# Patient Record
Sex: Female | Born: 1990 | Race: Black or African American | Hispanic: No | Marital: Single | Smoking: Never smoker
Health system: Southern US, Community
[De-identification: ages and names within clinical notes are randomized; demographics above are authoritative.]

## PROBLEM LIST (undated history)

## (undated) ENCOUNTER — Inpatient Hospital Stay (HOSPITAL_COMMUNITY): Payer: Self-pay

## (undated) DIAGNOSIS — R51 Headache: Secondary | ICD-10-CM

## (undated) DIAGNOSIS — G43909 Migraine, unspecified, not intractable, without status migrainosus: Secondary | ICD-10-CM

## (undated) HISTORY — PX: FOOT SURGERY: SHX648

---
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
## 2012-01-18 ENCOUNTER — Emergency Department (HOSPITAL_COMMUNITY)
Admission: EM | Admit: 2012-01-18 | Discharge: 2012-01-18 | Disposition: A | Discharge status: Home / Self Care | Attending: Emergency Medicine | Admitting: Emergency Medicine

## 2012-01-18 ENCOUNTER — Emergency Department (HOSPITAL_COMMUNITY)

## 2012-01-18 ENCOUNTER — Encounter (HOSPITAL_COMMUNITY): Payer: Self-pay

## 2012-01-18 DIAGNOSIS — R1031 Right lower quadrant pain: Secondary | ICD-10-CM | POA: Insufficient documentation

## 2012-01-18 DIAGNOSIS — R109 Unspecified abdominal pain: Secondary | ICD-10-CM

## 2012-01-18 LAB — BASIC METABOLIC PANEL
Calcium: 9.1 mg/dL (ref 8.4–10.5)
Creatinine, Ser: 0.78 mg/dL (ref 0.50–1.10)
GFR calc non Af Amer: >90 mL/min (ref >90)
Glucose, Bld: 86 mg/dL (ref 70–99)
Sodium: 139 mEq/L (ref 135–145)

## 2012-01-18 LAB — CBC WITH DIFFERENTIAL/PLATELET
Eosinophils Absolute: 0.1 10*3/uL (ref 0.0–0.7)
Lymphs Abs: 1.9 10*3/uL (ref 0.7–4.0)
MCH: 23.4 pg — ABNORMAL LOW (ref 26.0–34.0)
MCHC: 32.8 g/dL (ref 30.0–36.0)
Monocytes Absolute: 0.6 10*3/uL (ref 0.1–1.0)
Neutrophils Relative %: 59 % (ref 43–77)
Platelets: 304 10*3/uL (ref 150–400)

## 2012-01-18 LAB — URINALYSIS, ROUTINE W REFLEX MICROSCOPIC
Ketones, ur: NEGATIVE mg/dL
Protein, ur: NEGATIVE mg/dL
Urobilinogen, UA: 0.2 mg/dL (ref 0.0–1.0)

## 2012-01-18 LAB — URINE MICROSCOPIC-ADD ON

## 2012-01-18 LAB — POCT PREGNANCY, URINE: Preg Test, Ur: NEGATIVE

## 2012-01-18 MED ORDER — HYDROCODONE-ACETAMINOPHEN 5-325 MG PO TABS: 1.0000 | ORAL_TABLET | ORAL | Status: AC | PRN

## 2012-01-18 MED ORDER — HYDROMORPHONE HCL PF 2 MG/ML IJ SOLN
2.0000 mg | Freq: Once | INTRAMUSCULAR | Status: AC
  Administered 2012-01-18: 2 mg via INTRAMUSCULAR
  Filled 2012-01-18: qty 1

## 2012-01-18 NOTE — ED Notes (Signed)
Patient reports tht she has had RLQ pain that is sharp and shooting. Patient's mother denies fever, vomiting, or diarrhea.

## 2012-01-18 NOTE — ED Notes (Signed)
Family at bedside.(Pt's mother) 

## 2012-01-18 NOTE — ED Notes (Signed)
Patient transported to Ultrasound per pt's mother

## 2012-01-18 NOTE — ED Notes (Signed)
Pt aware of the need for a urine sample however, is unable to void at this time. 

## 2012-01-18 NOTE — ED Provider Notes (Signed)
History     CSN: [id number]  Arrival date & time 01/18/12  0807   First MD Initiated Contact with Patient 01/18/12 0827      Chief Complaint  Patient presents with  . Abdominal Pain    (Consider location/radiation/quality/duration/timing/severity/associated sxs/prior treatment) HPI Comments: Patient reports right lower quadrant pain that started immediately early this morning. She reports laying in bed during onset. She describes the pain as sharp, severe and does not radiate. The pain is constant, as it is currently present. She did not take anything for pain relief. She denies any alleviating factors. She denies fever, NVD, vaginal bleeding, vaginal discharge, dysuria. LMP was one month ago.   Patient is a 21 y.o. female presenting with abdominal pain.  Abdominal Pain The primary symptoms of the illness include abdominal pain.    History reviewed. No pertinent past medical history.  Past Surgical History  Procedure Date  . Foot surgery   . Foot surgery     History reviewed. No pertinent family history.  History  Substance Use Topics  . Smoking status: Never Smoker   . Smokeless tobacco: Not on file  . Alcohol Use: No    OB History    Grav Para Term Preterm Abortions TAB SAB Ect Mult Living                  Review of Systems  Gastrointestinal: Positive for abdominal pain.  All other systems reviewed and are negative.    Allergies  Review of patient's allergies indicates no known allergies.  Home Medications   Current Outpatient Rx  Name Route Sig Dispense Refill  . IBUPROFEN 200 MG PO TABS Oral Take 400 mg by mouth every 8 (eight) hours as needed. For pain.      BP 143/97  Pulse 111  Temp 98.6 F (37 C) (Oral)  Resp 20  Ht 5\' 5"  (1.651 m)  Wt 170 lb (77.111 kg)  BMI 28.29 kg/m2  SpO2 100%  LMP 01/18/2012  Physical Exam  Nursing note and vitals reviewed. Constitutional: She is oriented to person, place, and time. She appears well-developed and  well-nourished. She appears distressed.       Patient is upset and in pain.   HENT:  Head: Normocephalic and atraumatic.  Eyes: Conjunctivae are normal. No scleral icterus.  Neck: Normal range of motion. Neck supple.  Cardiovascular: Normal rate and regular rhythm.  Exam reveals no gallop and no friction rub.   No murmur heard. Pulmonary/Chest: Effort normal and breath sounds normal. No respiratory distress. She has no wheezes. She has no rales. She exhibits no tenderness.  Abdominal: Soft. She exhibits no distension and no mass. There is tenderness. There is guarding.       Tenderness to light palpation of RLQ.   Musculoskeletal: Normal range of motion.  Neurological: She is alert and oriented to person, place, and time. Coordination normal.  Skin: Skin is warm and dry. She is not diaphoretic.  Psychiatric: She has a normal mood and affect.    ED Course  Procedures (including critical care time)  Labs Reviewed  CBC WITH DIFFERENTIAL - Abnormal; Notable for the following:    Hemoglobin 11.3 (*)     HCT 34.4 (*)     MCV 71.4 (*)     MCH 23.4 (*)     All other components within normal limits  BASIC METABOLIC PANEL  URINALYSIS, ROUTINE W REFLEX MICROSCOPIC   No results found.   No diagnosis found.  MDM  10:30 AM Patient will have a pelvic ultrasound to evaluation for ovarian cyst and ovarian torsion. She will be kept comfortable with dilaudid.   Patient signed out to Dr. Patria Mane.       Emilia Beck, PA-C 01/21/12 1639

## 2012-01-18 NOTE — ED Notes (Signed)
Pt reports RLQ pain that started this am, denies any n/v/d or fever at this time.  Pt reports starting her period today as well.  Pt is guarding her RLQ at present.  Reports LBM x 3 days ago, states that this is normal for her.

## 2012-01-22 NOTE — ED Provider Notes (Signed)
Medical screening examination/treatment/procedure(s) were conducted as a shared visit with non-physician practitioner(s) and myself.  I personally evaluated the patient during the encounter  Repeat abdominal exam without any RLQ tenderness or guarding. Pelvic US normal. Dc home with pcp follow up and instructions to return to ER for new or worsening symptoms  1. Abdominal pain     US Transvaginal Non-ob  01/18/2012  *RADIOLOGY REPORT*  Clinical Data:  Severe right lower quadrant pain.  Evaluate for torsion.  LMP 01/18/2012  TRANSABDOMINAL AND TRANSVAGINAL ULTRASOUND OF PELVIS DOPPLER ULTRASOUND OF OVARIES  Technique:  Both transabdominal and transvaginal ultrasound examinations of the pelvis were performed. Transabdominal technique was performed for global imaging of the pelvis including uterus, ovaries, adnexal regions, and pelvic cul-de-sac.  It was necessary to proceed with endovaginal exam following the transabdominal exam to visualize the myometrium, endometrium and adnexa.  Color and duplex Doppler ultrasound was utilized to evaluate blood flow to the ovaries.  Comparison:  None.  Findings:  Uterus:  Demonstrates a sagittal length of 8.8 cm, depth of 3.6 cm and width of 3.4 cm.  A homogeneous myometrium is seen  Endometrium:  Appears thin and echogenic with a width of 3.5 mm. No areas of focal thickening or heterogeneity are seen and this would correlate with a secretory phase of the cycle and correspond with the provided LMP of 01/18/2012.  Right ovary: Measures 4.5 x 2.1 x 2.5 cm and has a normal appearance.  Color Doppler assessment reveals flow within the central aspect of the ovarian stroma  Left ovary:   Has a normal appearance measuring 4.3 x 2.2 x 2.3 cm and is best visualized transabdominally due to the high lateral position.  Color Doppler reveals the presence of flow within the central portion of the ovarian stroma.  Pulsed Doppler evaluation demonstrates normal low-resistance arterial and  venous waveforms in both ovaries.  Other:  No pelvic fluid or separate adnexal masses are seen.  IMPRESSION: Normal secretory appearance to the uterus and ovaries.  No sonographic evidence for ovarian torsion.   Original Report Authenticated By: Bertha Stakes, M.D.    US Pelvis Complete  01/18/2012  *RADIOLOGY REPORT*  Clinical Data:  Severe right lower quadrant pain.  Evaluate for torsion.  LMP 01/18/2012  TRANSABDOMINAL AND TRANSVAGINAL ULTRASOUND OF PELVIS DOPPLER ULTRASOUND OF OVARIES  Technique:  Both transabdominal and transvaginal ultrasound examinations of the pelvis were performed. Transabdominal technique was performed for global imaging of the pelvis including uterus, ovaries, adnexal regions, and pelvic cul-de-sac.  It was necessary to proceed with endovaginal exam following the transabdominal exam to visualize the myometrium, endometrium and adnexa.  Color and duplex Doppler ultrasound was utilized to evaluate blood flow to the ovaries.  Comparison:  None.  Findings:  Uterus:  Demonstrates a sagittal length of 8.8 cm, depth of 3.6 cm and width of 3.4 cm.  A homogeneous myometrium is seen  Endometrium:  Appears thin and echogenic with a width of 3.5 mm. No areas of focal thickening or heterogeneity are seen and this would correlate with a secretory phase of the cycle and correspond with the provided LMP of 01/18/2012.  Right ovary: Measures 4.5 x 2.1 x 2.5 cm and has a normal appearance.  Color Doppler assessment reveals flow within the central aspect of the ovarian stroma  Left ovary:   Has a normal appearance measuring 4.3 x 2.2 x 2.3 cm and is best visualized transabdominally due to the high lateral position.  Color Doppler reveals the presence  of flow within the central portion of the ovarian stroma.  Pulsed Doppler evaluation demonstrates normal low-resistance arterial and venous waveforms in both ovaries.  Other:  No pelvic fluid or separate adnexal masses are seen.  IMPRESSION: Normal  secretory appearance to the uterus and ovaries.  No sonographic evidence for ovarian torsion.   Original Report Authenticated By: Bertha Stakes, M.D.    [place] Art/ven Flow Abd Pelv Doppler  01/18/2012  *RADIOLOGY REPORT*  Clinical Data:  Severe right lower quadrant pain.  Evaluate for torsion.  LMP 01/18/2012  TRANSABDOMINAL AND TRANSVAGINAL ULTRASOUND OF PELVIS DOPPLER ULTRASOUND OF OVARIES  Technique:  Both transabdominal and transvaginal ultrasound examinations of the pelvis were performed. Transabdominal technique was performed for global imaging of the pelvis including uterus, ovaries, adnexal regions, and pelvic cul-de-sac.  It was necessary to proceed with endovaginal exam following the transabdominal exam to visualize the myometrium, endometrium and adnexa.  Color and duplex Doppler ultrasound was utilized to evaluate blood flow to the ovaries.  Comparison:  None.  Findings:  Uterus:  Demonstrates a sagittal length of 8.8 cm, depth of 3.6 cm and width of 3.4 cm.  A homogeneous myometrium is seen  Endometrium:  Appears thin and echogenic with a width of 3.5 mm. No areas of focal thickening or heterogeneity are seen and this would correlate with a secretory phase of the cycle and correspond with the provided LMP of 01/18/2012.  Right ovary: Measures 4.5 x 2.1 x 2.5 cm and has a normal appearance.  Color Doppler assessment reveals flow within the central aspect of the ovarian stroma  Left ovary:   Has a normal appearance measuring 4.3 x 2.2 x 2.3 cm and is best visualized transabdominally due to the high lateral position.  Color Doppler reveals the presence of flow within the central portion of the ovarian stroma.  Pulsed Doppler evaluation demonstrates normal low-resistance arterial and venous waveforms in both ovaries.  Other:  No pelvic fluid or separate adnexal masses are seen.  IMPRESSION: Normal secretory appearance to the uterus and ovaries.  No sonographic evidence for ovarian torsion.   Original  Report Authenticated By: Bertha Stakes, M.D.    I personally reviewed the imaging tests through PACS system  I reviewed available ER/hospitalization records thought the EMR   Lyanne Co, MD 01/22/12 1425

## 2012-02-07 IMAGING — CR DG ANKLE COMPLETE 3+V*L*
3 series · 3 of 3 positions shown · non-contrast
Comparison: Left ankle radiographs performed 02/01/2011, and MRI of
the left ankle performed 02/19/2011

CLINICAL DATA: Left ankle pain.

LEFT ANKLE COMPLETE - 3+ VIEW

[x ankle ap left]
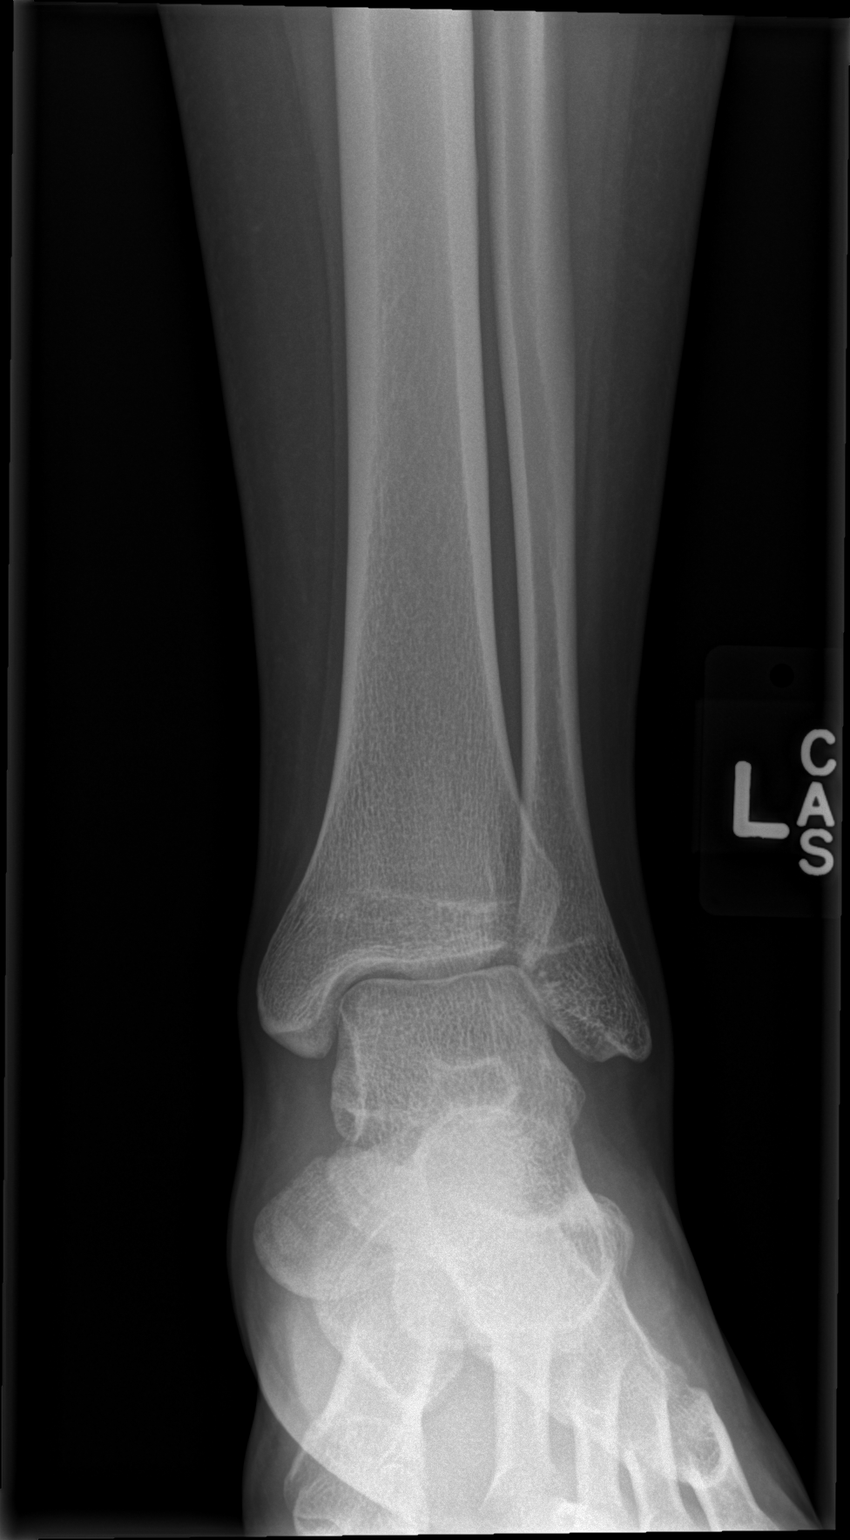

[x ankle obl left]
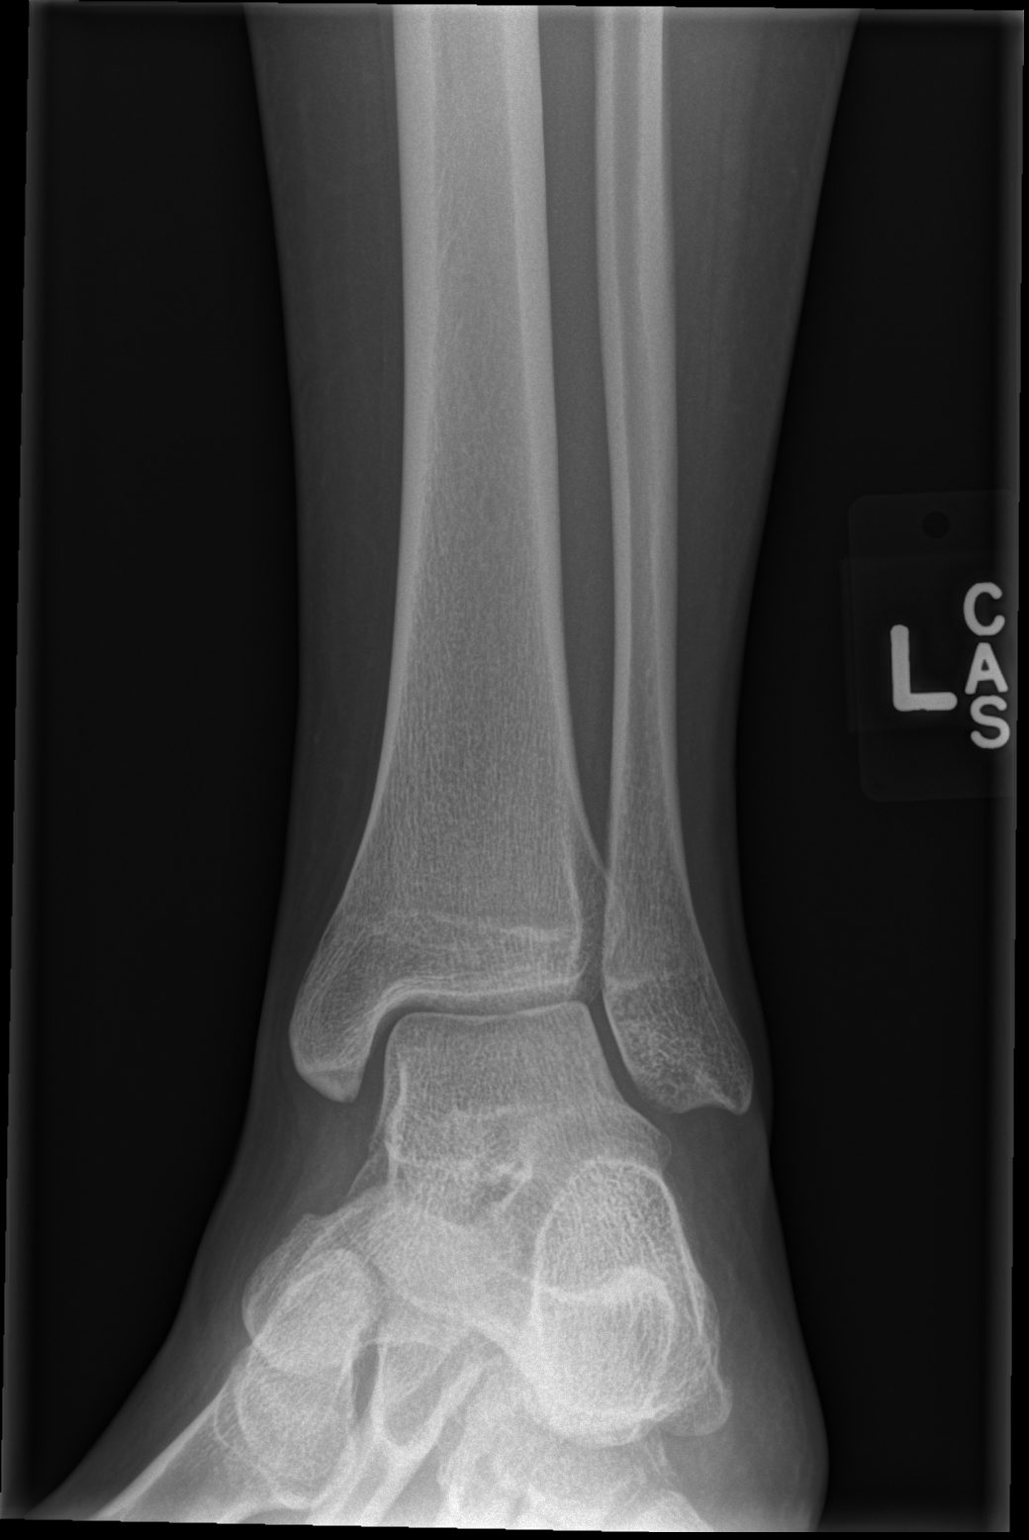

[x ankle lat left]
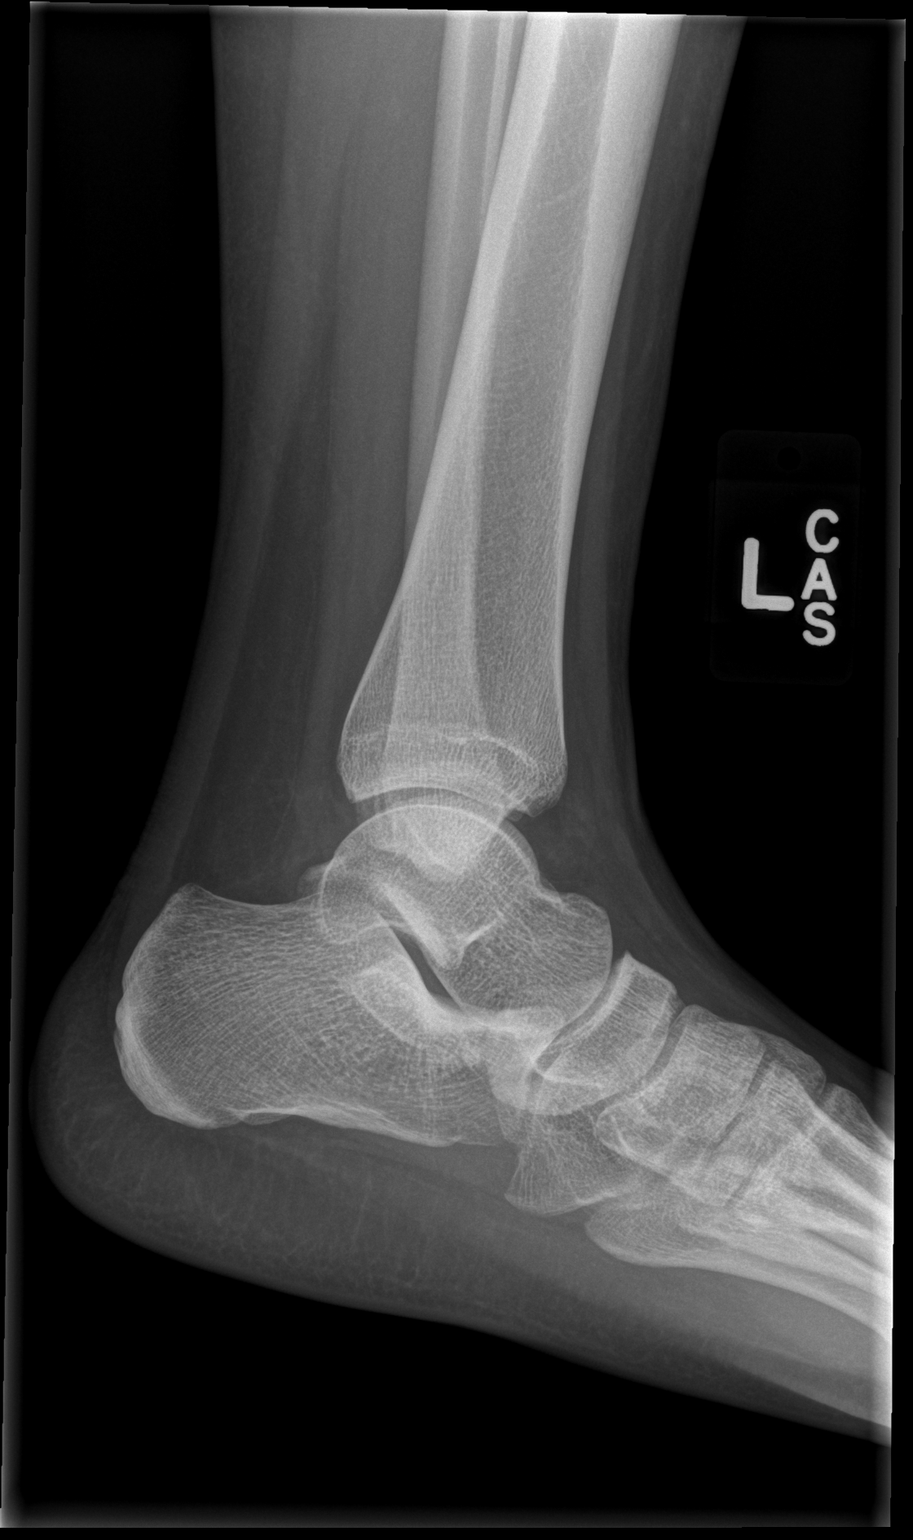

[3 of 3 positions shown; findings below may reference images not displayed]

FINDINGS: There is no evidence of fracture or dislocation.  The
ankle mortise is intact; the interosseous space is within normal
limits.  No talar tilt or subluxation is seen.

The joint spaces are preserved.  No significant soft tissue
abnormalities are seen.
IMPRESSION: 1.  No evidence of fracture or dislocation.
2.  There was evidence of os navicularis syndrome on the recent MRI
of the left ankle, with possible stress injury; the patient's os
naviculare is not well characterized on this study.

## 2012-02-08 ENCOUNTER — Emergency Department (HOSPITAL_COMMUNITY)
Admission: EM | Admit: 2012-02-08 | Discharge: 2012-02-09 | Disposition: A | Discharge status: Home / Self Care | Attending: Emergency Medicine | Admitting: Emergency Medicine

## 2012-02-08 ENCOUNTER — Encounter (HOSPITAL_COMMUNITY): Payer: Self-pay | Admitting: *Deleted

## 2012-02-08 DIAGNOSIS — K2970 Gastritis, unspecified, without bleeding: Secondary | ICD-10-CM | POA: Insufficient documentation

## 2012-02-08 DIAGNOSIS — R1013 Epigastric pain: Secondary | ICD-10-CM

## 2012-02-08 DIAGNOSIS — K2990 Gastroduodenitis, unspecified, without bleeding: Secondary | ICD-10-CM | POA: Insufficient documentation

## 2012-02-08 MED ORDER — GI COCKTAIL ~~LOC~~
30.0000 mL | Freq: Once | ORAL | Status: AC
  Administered 2012-02-08: 30 mL via ORAL
  Filled 2012-02-08: qty 30

## 2012-02-08 MED ORDER — ONDANSETRON 8 MG PO TBDP
8.0000 mg | ORAL_TABLET | Freq: Once | ORAL | Status: AC
  Administered 2012-02-08: 8 mg via ORAL
  Filled 2012-02-08: qty 1

## 2012-02-08 NOTE — ED Notes (Signed)
Pt c/o sore throat this morning; abd pain increasing all day; nausea no vomiting

## 2012-02-08 NOTE — ED Notes (Signed)
Pt sts she has had headache since last night, sts she began having epigastric pain when she awoke this morning, along with headache that has not been helped by motrin her mother gave her earlier in day. Pt sts she was nauseous earlier today but denies vomiting. Mother sts daughter felt hot earlier today, no actual temperature was taken. Patient alert and oriented.

## 2012-02-09 LAB — COMPREHENSIVE METABOLIC PANEL
ALT: 11 U/L (ref 0–35)
AST: 11 U/L (ref 0–37)
Alkaline Phosphatase: 51 U/L (ref 39–117)
CO2: 25 mEq/L (ref 19–32)
Calcium: 8.6 mg/dL (ref 8.4–10.5)
Chloride: 105 mEq/L (ref 96–112)
GFR calc Af Amer: >90 mL/min (ref >90)
GFR calc non Af Amer: >90 mL/min (ref >90)
Glucose, Bld: 82 mg/dL (ref 70–99)
Potassium: 4.3 mEq/L (ref 3.5–5.1)
Sodium: 137 mEq/L (ref 135–145)

## 2012-02-09 LAB — CBC WITH DIFFERENTIAL/PLATELET
Basophils Relative: 0 % (ref 0–1)
Eosinophils Relative: 3 % (ref 0–5)
Hemoglobin: 10.6 g/dL — ABNORMAL LOW (ref 12.0–15.0)
Lymphocytes Relative: 41 % (ref 12–46)
MCH: 23 pg — ABNORMAL LOW (ref 26.0–34.0)
Monocytes Absolute: 0.6 10*3/uL (ref 0.1–1.0)
Monocytes Relative: 9 % (ref 3–12)
Neutrophils Relative %: 47 % (ref 43–77)
Platelets: 326 10*3/uL (ref 150–400)
RBC: 4.6 MIL/uL (ref 3.87–5.11)
WBC: 6.6 10*3/uL (ref 4.0–10.5)

## 2012-02-09 LAB — LIPASE, BLOOD: Lipase: 28 U/L (ref 11–59)

## 2012-02-09 LAB — URINALYSIS, ROUTINE W REFLEX MICROSCOPIC
Bilirubin Urine: NEGATIVE
Nitrite: NEGATIVE
Specific Gravity, Urine: 1.025 (ref 1.005–1.030)
Urobilinogen, UA: 1 mg/dL (ref 0.0–1.0)

## 2012-02-09 LAB — URINE MICROSCOPIC-ADD ON

## 2012-02-09 MED ORDER — FAMOTIDINE 20 MG PO TABS: 20.0000 mg | ORAL_TABLET | Freq: Two times a day (BID) | ORAL | Status: DC

## 2012-02-09 NOTE — ED Provider Notes (Signed)
History     CSN: [id number]  Arrival date & time 02/08/12  2231   First MD Initiated Contact with Patient 02/08/12 2303      Chief Complaint  Patient presents with  . Abdominal Pain    (Consider location/radiation/quality/duration/timing/severity/associated sxs/prior treatment) HPI 21 year old female presents to emergency room complaining of upper abdominal pain since this morning. Patient reports she had sore throat and headache earlier today, resolved after taking Motrin. Abdominal pain has continued however. She has nausea but no vomiting. Patient ate [place] food last night, but no one else is sick from this a meal. Pain is described as a throbbing sensation. She denies any diarrhea. No recent travel. Patient has had subjective fevers. She has not taken any medication prior to arrival other than ibuprofen. No prior history of same.  History reviewed. No pertinent past medical history.  Past Surgical History  Procedure Date  . Foot surgery   . Foot surgery     No family history on file.  History  Substance Use Topics  . Smoking status: Never Smoker   . Smokeless tobacco: Not on file  . Alcohol Use: No    OB History    Grav Para Term Preterm Abortions TAB SAB Ect Mult Living                  Review of Systems  All other systems reviewed and are negative.    Allergies  Review of patient's allergies indicates no known allergies.  Home Medications   Current Outpatient Rx  Name Route Sig Dispense Refill  . IBUPROFEN 200 MG PO TABS Oral Take 400 mg by mouth every 8 (eight) hours as needed. For pain.      BP 118/67  Pulse 75  Temp 98 F (36.7 C) (Oral)  Resp 16  SpO2 100%  LMP 01/18/2012  Physical Exam  Nursing note and vitals reviewed. Constitutional: She is oriented to person, place, and time. She appears well-developed and well-nourished. No distress (Patient does not appear in significant pain, laughing joking with people in the room).  HENT:  Head:  Normocephalic and atraumatic.  Nose: Nose normal.  Mouth/Throat: Oropharynx is clear and moist.  Eyes: Conjunctivae normal and EOM are normal. Pupils are equal, round, and reactive to light.  Neck: Normal range of motion. Neck supple. No JVD present. No tracheal deviation present. No thyromegaly present.  Cardiovascular: Normal rate, regular rhythm, normal heart sounds and intact distal pulses.  Exam reveals no gallop and no friction rub.   No murmur heard. Pulmonary/Chest: Effort normal and breath sounds normal. No stridor. No respiratory distress. She has no wheezes. She has no rales. She exhibits no tenderness.  Abdominal: Soft. She exhibits no distension and no mass. There is tenderness (tenderness in the epigastrium). There is no rebound and no guarding.       Hyperactive bowel sounds  Musculoskeletal: Normal range of motion. She exhibits no edema and no tenderness.  Lymphadenopathy:    She has no cervical adenopathy.  Neurological: She is alert and oriented to person, place, and time. She exhibits normal muscle tone. Coordination normal.  Skin: Skin is warm and dry. No rash noted. No erythema. No pallor.  Psychiatric: She has a normal mood and affect. Her behavior is normal. Judgment and thought content normal.    ED Course  Procedures (including critical care time)  Labs Reviewed  CBC WITH DIFFERENTIAL - Abnormal; Notable for the following:    Hemoglobin 10.6 (*)  HCT 33.3 (*)     MCV 72.4 (*)     MCH 23.0 (*)     All other components within normal limits  COMPREHENSIVE METABOLIC PANEL - Abnormal; Notable for the following:    Albumin 3.3 (*)     Total Bilirubin 0.1 (*)     All other components within normal limits  URINALYSIS, ROUTINE W REFLEX MICROSCOPIC - Abnormal; Notable for the following:    Leukocytes, UA SMALL (*)     All other components within normal limits  URINE MICROSCOPIC-ADD ON - Abnormal; Notable for the following:    Bacteria, UA FEW (*)     All other  components within normal limits  LIPASE, BLOOD  PREGNANCY, URINE   No results found.   1. Epigastric pain   2. Gastritis       MDM  21 year old female with upper bowel pain. Suspect gastritis. Patient overall looks well. Will get ODT Zofran and GI cocktail and reassess after lab work has returned. Do not feel symptoms are secondary to cholelithiasis/cholecystitis, peptic ulcer disease or other serious abdominal complaint.        Olivia Mackie, MD 02/09/12 0157

## 2012-02-09 NOTE — ED Notes (Signed)
Patient given discharge instructions, information, prescriptions, and diet order. Patient states that they adequately understand discharge information given and to return to ED if symptoms return or worsen.    Pt informed to eat bland diet and to come back with any further complaints.

## 2012-03-17 ENCOUNTER — Emergency Department (HOSPITAL_COMMUNITY)
Admission: EM | Admit: 2012-03-17 | Discharge: 2012-03-17 | Disposition: A | Discharge status: Home / Self Care | Attending: Emergency Medicine | Admitting: Emergency Medicine

## 2012-03-17 ENCOUNTER — Encounter (HOSPITAL_COMMUNITY): Payer: Self-pay | Admitting: Emergency Medicine

## 2012-03-17 DIAGNOSIS — IMO0001 Reserved for inherently not codable concepts without codable children: Secondary | ICD-10-CM | POA: Insufficient documentation

## 2012-03-17 DIAGNOSIS — Z79899 Other long term (current) drug therapy: Secondary | ICD-10-CM | POA: Insufficient documentation

## 2012-03-17 DIAGNOSIS — J029 Acute pharyngitis, unspecified: Secondary | ICD-10-CM | POA: Insufficient documentation

## 2012-03-17 DIAGNOSIS — B349 Viral infection, unspecified: Secondary | ICD-10-CM

## 2012-03-17 DIAGNOSIS — M2550 Pain in unspecified joint: Secondary | ICD-10-CM | POA: Insufficient documentation

## 2012-03-17 DIAGNOSIS — R52 Pain, unspecified: Secondary | ICD-10-CM | POA: Insufficient documentation

## 2012-03-17 DIAGNOSIS — B9789 Other viral agents as the cause of diseases classified elsewhere: Secondary | ICD-10-CM | POA: Insufficient documentation

## 2012-03-17 DIAGNOSIS — J00 Acute nasopharyngitis [common cold]: Secondary | ICD-10-CM | POA: Insufficient documentation

## 2012-03-17 MED ORDER — IBUPROFEN 600 MG PO TABS: 600.0000 mg | ORAL_TABLET | Freq: Four times a day (QID) | ORAL | Status: DC | PRN

## 2012-03-17 MED ORDER — BENADRYL 25 MG PO TABS: 25.0000 mg | ORAL_TABLET | Freq: Four times a day (QID) | ORAL | Status: DC | PRN

## 2012-03-17 MED ORDER — DIPHENHYDRAMINE HCL 25 MG PO CAPS
25.0000 mg | ORAL_CAPSULE | Freq: Once | ORAL | Status: AC
  Administered 2012-03-17: 25 mg via ORAL
  Filled 2012-03-17: qty 1

## 2012-03-17 MED ORDER — IBUPROFEN 800 MG PO TABS
800.0000 mg | ORAL_TABLET | Freq: Once | ORAL | Status: AC
  Administered 2012-03-17: 800 mg via ORAL
  Filled 2012-03-17: qty 1

## 2012-03-17 NOTE — ED Provider Notes (Signed)
Medical screening examination/treatment/procedure(s) were performed by non-physician practitioner and as supervising physician I was immediately available for consultation/collaboration.   Gwyneth Sprout, MD 03/17/12 [phone]

## 2012-03-17 NOTE — ED Notes (Signed)
Pt states that this morning she started having nasal burning and congestion along with body aches. Reports fever.

## 2012-03-17 NOTE — ED Provider Notes (Signed)
History     CSN: [id number]  Arrival date & time 03/17/12  1714   First MD Initiated Contact with Patient 03/17/12 2007      Chief Complaint  Patient presents with  . Nasal Congestion  . Generalized Body Aches    (Consider location/radiation/quality/duration/timing/severity/associated sxs/prior treatment) Patient is a 21 y.o. female presenting with URI. The history is provided by the patient and a friend. No language interpreter was used.  URI The primary symptoms include fatigue, sore throat, myalgias and arthralgias. Primary symptoms do not include fever, headaches, ear pain, swollen glands, cough, abdominal pain, nausea or vomiting. The current episode started today. This is a new problem. The problem has not changed since onset. Symptoms associated with the illness include congestion. The illness is not associated with chills, facial pain, sinus pressure or rhinorrhea.  20yo with c/o upper respiratory symptoms since this am.  Denies fever or cough. Nasal congestion and body aches and pains. No sick contact, ear pain  History reviewed. No pertinent past medical history.  Past Surgical History  Procedure Date  . Foot surgery   . Foot surgery     No family history on file.  History  Substance Use Topics  . Smoking status: Never Smoker   . Smokeless tobacco: Not on file  . Alcohol Use: No    OB History    Grav Para Term Preterm Abortions TAB SAB Ect Mult Living                  Review of Systems  Constitutional: Positive for fatigue. Negative for fever and chills.  HENT: Positive for congestion and sore throat. Negative for ear pain, rhinorrhea and sinus pressure.   Respiratory: Negative for cough.   Gastrointestinal: Negative for nausea, vomiting and abdominal pain.  Musculoskeletal: Positive for myalgias and arthralgias.  Neurological: Negative for headaches.    Allergies  Review of patient's allergies indicates no known allergies.  Home Medications    Current Outpatient Rx  Name  Route  Sig  Dispense  Refill  . NORELGESTROMIN-ETH ESTRADIOL 150-20 MCG/24HR TD PTWK   Transdermal   Place 1 patch onto the skin once a week. On saturdays         . BENADRYL 25 MG PO TABS   Oral   Take 1 tablet (25 mg total) by mouth every 6 (six) hours as needed (as needed for nasal congestion).   20 tablet   0     Dispense as written.   . IBUPROFEN 600 MG PO TABS   Oral   Take 1 tablet (600 mg total) by mouth every 6 (six) hours as needed for pain.   30 tablet   0     BP 120/81  Pulse 91  Temp 98.5 F (36.9 C) (Oral)  Resp 18  SpO2 99%  LMP 03/10/2012  Physical Exam  Nursing note and vitals reviewed. Constitutional: She is oriented to person, place, and time. She appears well-developed and well-nourished.  HENT:  Head: Normocephalic and atraumatic.  Right Ear: Tympanic membrane normal.  Left Ear: Tympanic membrane normal.  Nose: Nose normal.  Mouth/Throat: Uvula is midline, oropharynx is clear and moist and mucous membranes are normal.  Eyes: Conjunctivae normal and EOM are normal. Pupils are equal, round, and reactive to light.  Neck: Normal range of motion. Neck supple.  Cardiovascular: Normal rate.   Pulmonary/Chest: Effort normal.  Abdominal: Soft.  Musculoskeletal: Normal range of motion. She exhibits no edema and no tenderness.  Neurological:  She is alert and oriented to person, place, and time. She has normal reflexes.  Skin: Skin is warm and dry.  Psychiatric: She has a normal mood and affect.    ED Course  Procedures (including critical care time)  Labs Reviewed - No data to display No results found.   1. Viral infection   2. Cold       MDM   21 year old female with upper respiratory viral symptoms starting this morning. Also needs a note for school which is today. Patient received ibuprofen and Benadryl in the ER today. No acute distress noted. Will follow the PCP of her choice as needed. Understands to  return here for worsening symptoms high fever nausea vomiting diarrhea or uncontrolled.       Remi Haggard, NP 03/17/12 2049

## 2012-07-08 ENCOUNTER — Emergency Department (HOSPITAL_COMMUNITY)
Admission: EM | Admit: 2012-07-08 | Discharge: 2012-07-08 | Disposition: A | Discharge status: Home / Self Care | Attending: Emergency Medicine | Admitting: Emergency Medicine

## 2012-07-08 ENCOUNTER — Encounter (HOSPITAL_COMMUNITY): Payer: Self-pay | Admitting: Emergency Medicine

## 2012-07-08 DIAGNOSIS — R197 Diarrhea, unspecified: Secondary | ICD-10-CM | POA: Insufficient documentation

## 2012-07-08 DIAGNOSIS — R112 Nausea with vomiting, unspecified: Secondary | ICD-10-CM | POA: Insufficient documentation

## 2012-07-08 DIAGNOSIS — E860 Dehydration: Secondary | ICD-10-CM | POA: Insufficient documentation

## 2012-07-08 DIAGNOSIS — J029 Acute pharyngitis, unspecified: Secondary | ICD-10-CM | POA: Insufficient documentation

## 2012-07-08 LAB — POCT I-STAT, CHEM 8
BUN: 12 mg/dL (ref 6–23)
Calcium, Ion: 1.12 mmol/L (ref 1.12–1.23)
Chloride: 105 mEq/L (ref 96–112)
Potassium: 3.8 mEq/L (ref 3.5–5.1)
Sodium: 139 mEq/L (ref 135–145)

## 2012-07-08 LAB — RAPID STREP SCREEN (MED CTR MEBANE ONLY): Streptococcus, Group A Screen (Direct): NEGATIVE

## 2012-07-08 MED ORDER — ONDANSETRON 8 MG PO TBDP: 8.0000 mg | ORAL_TABLET | Freq: Three times a day (TID) | ORAL | Status: DC | PRN

## 2012-07-08 MED ORDER — ONDANSETRON HCL 4 MG/2ML IJ SOLN
4.0000 mg | Freq: Once | INTRAMUSCULAR | Status: AC
  Administered 2012-07-08: 4 mg via INTRAVENOUS
  Filled 2012-07-08: qty 2

## 2012-07-08 MED ORDER — SODIUM CHLORIDE 0.9 % IV SOLN
1000.0000 mL | Freq: Once | INTRAVENOUS | Status: AC
  Administered 2012-07-08: 1000 mL via INTRAVENOUS

## 2012-07-08 MED ORDER — SODIUM CHLORIDE 0.9 % IV SOLN: 1000.0000 mL | INTRAVENOUS | Status: DC

## 2012-07-08 NOTE — Progress Notes (Signed)
PCP is Dr Melven Sartorius at [facility] EPIC updated

## 2012-07-08 NOTE — ED Notes (Signed)
Per pt, symptoms started yesterday, fever, could not keep food/liquids down

## 2012-07-08 NOTE — ED Provider Notes (Signed)
History     CSN: [id number]  Arrival date & time 07/08/12  3086   First MD Initiated Contact with Patient 07/08/12 [phone]      Chief Complaint  Patient presents with  . Headache  . Sore Throat    (Consider location/radiation/quality/duration/timing/severity/associated sxs/prior treatment) HPI Patient states yesterday she started getting a sore throat and has had nausea with vomiting about 4 times and watery diarrhea also about 4 times. She denies abdominal pain. She states she feels weak. She's had a dry cough and some rhinorrhea and a slight green. She states her highest temp was  101 however she denies having chills. She states she has ringing in her ears. She denies being around anybody else is ill.  PCP [facility] Dr "E"  History reviewed. No pertinent past medical history.  Past Surgical History  Procedure Laterality Date  . Foot surgery    . Foot surgery      No family history on file.  History  Substance Use Topics  . Smoking status: Never Smoker   . Smokeless tobacco: Not on file  . Alcohol Use: No  student at [employer]  OB History   Grav Para Term Preterm Abortions TAB SAB Ect Mult Living                  Review of Systems  All other systems reviewed and are negative.    Allergies  Review of patient's allergies indicates no known allergies.  Home Medications   Current Outpatient Rx  Name  Route  Sig  Dispense  Refill  . norelgestromin-ethinyl estradiol (ORTHO EVRA) 150-20 MCG/24HR transdermal patch   Transdermal   Place 1 patch onto the skin once a week. On saturdays           BP 118/90  Pulse 102  Temp(Src) 99 F (37.2 C)  Resp 18  SpO2 100%  LMP 06/07/2012  Vital signs normal except for tachycardia, low grade temp  Orthostatic VS are borderline for pulses   Physical Exam  Nursing note and vitals reviewed. Constitutional: She is oriented to person, place, and time. She appears well-developed and  well-nourished.  Non-toxic appearance. She does not appear ill. No distress.  HENT:  Head: Normocephalic and atraumatic.  Right Ear: External ear normal.  Left Ear: External ear normal.  Nose: Nose normal. No mucosal edema or rhinorrhea.  Mouth/Throat: Mucous membranes are normal. No dental abscesses or edematous.  Tongue dry  Eyes: Conjunctivae and EOM are normal. Pupils are equal, round, and reactive to light.  Neck: Normal range of motion and full passive range of motion without pain. Neck supple.  Cardiovascular: Normal rate, regular rhythm and normal heart sounds.  Exam reveals no gallop and no friction rub.   No murmur heard. Pulmonary/Chest: Effort normal and breath sounds normal. No respiratory distress. She has no wheezes. She has no rhonchi. She has no rales. She exhibits no tenderness and no crepitus.  Abdominal: Soft. Normal appearance and bowel sounds are normal. She exhibits no distension. There is no tenderness. There is no rebound and no guarding.  Musculoskeletal: Normal range of motion. She exhibits no edema and no tenderness.  Moves all extremities well.   Neurological: She is alert and oriented to person, place, and time. She has normal strength. No cranial nerve deficit.  Skin: Skin is warm, dry and intact. No rash noted. No erythema. No pallor.  Psychiatric: She has a normal mood and affect. Her speech is normal  and behavior is normal. Her mood appears not anxious.    ED Course  Procedures (including critical care time)  Medications  0.9 %  sodium chloride infusion (1,000 mLs Intravenous New Bag/Given 07/08/12 1019)    Followed by  0.9 %  sodium chloride infusion (not administered)    Followed by  0.9 %  sodium chloride infusion (not administered)  ondansetron (ZOFRAN) injection 4 mg (4 mg Intravenous Given 07/08/12 1020)  ondansetron (ZOFRAN) injection 4 mg (4 mg Intravenous Given 07/08/12 1318)   Patient given choice to have IV fluids or try oral Zofran with oral  hydration. She chose to have IV fluids.  13:00, First IV still running, pt states she was bending her arm and slowing the rate. Is feeling better, but still has some nausea. Will repeat her zofran.   Recheck 14:50 pt has had 1 liter of IV fluids. No urine output at this point.   Results for orders placed during the hospital encounter of 07/08/12  RAPID STREP SCREEN      Result Value Range   Streptococcus, Group A Screen (Direct) NEGATIVE  NEGATIVE  POCT I-STAT, CHEM 8      Result Value Range   Sodium 139  135 - 145 mEq/L   Potassium 3.8  3.5 - 5.1 mEq/L   Chloride 105  96 - 112 mEq/L   BUN 12  6 - 23 mg/dL   Creatinine, Ser 1.61  0.50 - 1.10 mg/dL   Glucose, Bld 90  70 - 99 mg/dL   Calcium, Ion 0.96  0.45 - 1.23 mmol/L   TCO2 25  0 - 100 mmol/L   Hemoglobin 13.3  12.0 - 15.0 g/dL   HCT 40.9  81.1 - 91.4 %   Laboratory interpretation all normal     1. Sore throat   2. Nausea vomiting and diarrhea   3. Dehydration     New Prescriptions   ONDANSETRON (ZOFRAN ODT) 8 MG DISINTEGRATING TABLET    Take 1 tablet (8 mg total) by mouth every 8 (eight) hours as needed for nausea.    Plan discharge  Devoria Albe, MD, FACEP   MDM          Ward Givens, MD 07/08/12 [phone]

## 2012-07-22 DIAGNOSIS — R42 Dizziness and giddiness: Secondary | ICD-10-CM | POA: Insufficient documentation

## 2012-07-22 DIAGNOSIS — G43909 Migraine, unspecified, not intractable, without status migrainosus: Secondary | ICD-10-CM | POA: Insufficient documentation

## 2012-07-22 DIAGNOSIS — H53149 Visual discomfort, unspecified: Secondary | ICD-10-CM | POA: Insufficient documentation

## 2012-07-22 DIAGNOSIS — R110 Nausea: Secondary | ICD-10-CM | POA: Insufficient documentation

## 2012-07-23 ENCOUNTER — Emergency Department (HOSPITAL_COMMUNITY)
Admission: EM | Admit: 2012-07-23 | Discharge: 2012-07-23 | Disposition: A | Discharge status: Home / Self Care | Attending: Emergency Medicine | Admitting: Emergency Medicine

## 2012-07-23 ENCOUNTER — Emergency Department (HOSPITAL_COMMUNITY)

## 2012-07-23 ENCOUNTER — Encounter (HOSPITAL_COMMUNITY): Payer: Self-pay

## 2012-07-23 MED ORDER — METOCLOPRAMIDE HCL 5 MG/ML IJ SOLN
10.0000 mg | Freq: Once | INTRAMUSCULAR | Status: AC
  Administered 2012-07-23: 10 mg via INTRAVENOUS
  Filled 2012-07-23: qty 2

## 2012-07-23 MED ORDER — DEXAMETHASONE SODIUM PHOSPHATE 4 MG/ML IJ SOLN
10.0000 mg | Freq: Once | INTRAMUSCULAR | Status: AC
  Administered 2012-07-23: 4 mg via INTRAVENOUS
  Filled 2012-07-23: qty 1

## 2012-07-23 MED ORDER — DIPHENHYDRAMINE HCL 50 MG/ML IJ SOLN
25.0000 mg | Freq: Once | INTRAMUSCULAR | Status: AC
  Administered 2012-07-23: 03:00:00 via INTRAVENOUS
  Filled 2012-07-23: qty 1

## 2012-07-23 MED ORDER — SODIUM CHLORIDE 0.9 % IV BOLUS (SEPSIS)
1000.0000 mL | Freq: Once | INTRAVENOUS | Status: AC
  Administered 2012-07-23: 1000 mL via INTRAVENOUS

## 2012-07-23 MED ORDER — IBUPROFEN 800 MG PO TABS: 800.0000 mg | ORAL_TABLET | Freq: Three times a day (TID) | ORAL | Status: DC

## 2012-07-23 MED ORDER — PROMETHAZINE HCL 25 MG PO TABS: 25.0000 mg | ORAL_TABLET | Freq: Four times a day (QID) | ORAL | Status: DC | PRN

## 2012-07-23 NOTE — ED Provider Notes (Signed)
History     CSN: [id number]  Arrival date & time 07/22/12  2357   First MD Initiated Contact with Patient 07/23/12 [phone]      Chief Complaint  Patient presents with  . Migraine    (Consider location/radiation/quality/duration/timing/severity/associated sxs/prior treatment) HPI History provided by pt.   Pt has had a daily left frontal headache for the past 2 months.  Can occur any time of the day and usually lasts 2 hours, improving w/ ibuprofen.  Pain has progressively increased.  Lasted all day today and was refractory to ibuprofen for the first time.  Associated w/ lightheadedness, intermittent blurred vision, photo/phonophobia and nausea.  Denies fever and extremity paresthesias/weakness.  No recent head trauma.  No h/o migraines.   History reviewed. No pertinent past medical history.  Past Surgical History  Procedure Laterality Date  . Foot surgery    . Foot surgery      History reviewed. No pertinent family history.  History  Substance Use Topics  . Smoking status: Never Smoker   . Smokeless tobacco: Not on file  . Alcohol Use: No    OB History   Grav Para Term Preterm Abortions TAB SAB Ect Mult Living                  Review of Systems  All other systems reviewed and are negative.    Allergies  Review of patient's allergies indicates no known allergies.  Home Medications   Current Outpatient Rx  Name  Route  Sig  Dispense  Refill  . ibuprofen (ADVIL,MOTRIN) 200 MG tablet   Oral   Take 800 mg by mouth every 6 (six) hours as needed for pain.         Marland Kitchen norelgestromin-ethinyl estradiol (ORTHO EVRA) 150-20 MCG/24HR transdermal patch   Transdermal   Place 1 patch onto the skin once a week. On saturdays           LMP 06/07/2012  Physical Exam  Nursing note and vitals reviewed. Constitutional: She is oriented to person, place, and time. She appears well-developed and well-nourished.  HENT:  Head: Normocephalic and atraumatic.  No tenderness of  sinuses or temples.   Eyes:  Normal appearance  Neck: Normal range of motion. Neck supple. No rigidity. No Brudzinski's sign and no Kernig's sign noted.  Cardiovascular: Normal rate, regular rhythm and intact distal pulses.   Pulmonary/Chest: Effort normal and breath sounds normal.  Musculoskeletal: Normal range of motion.  Neurological: She is alert and oriented to person, place, and time. No sensory deficit. Coordination normal.  CN 3-12 intact.  No nystagmus.  5/5 and equal upper and lower extremity strength.  No past pointing.    Skin: Skin is warm and dry. No rash noted.  Psychiatric: She has a normal mood and affect. Her behavior is normal.    ED Course  Procedures (including critical care time)  Labs Reviewed - No data to display Ct Head Wo Contrast  07/23/2012  *RADIOLOGY REPORT*  Clinical Data: Headache  CT HEAD WITHOUT CONTRAST  Technique:  Contiguous axial images were obtained from the base of the skull through the vertex without contrast.  Comparison: None.  Findings: There is no evidence for acute hemorrhage, hydrocephalus, mass lesion, or abnormal extra-axial fluid collection.  No definite CT evidence for acute infarction.  The visualized paranasal sinuses and mastoid air cells are predominately clear.  IMPRESSION: No acute intracranial abnormality.   Original Report Authenticated By: Jearld Lesch, M.D.  1. Headache       MDM  21yo healthy F presents w/ 2 months daily headaches.  Sx progressively worsening.  On exam, afebrile, non-toxic appearing, no focal neuro deficits or meningeal signs.  CT head ordered d/t duration and course of sx.  Pt receiving IVF as well as reglan, decadron and benadryl.  Will reassess shortly.  3:22 AM    CT head neg.  Results discussed w/ pt.  Her pain has resolved.  D/c'd home w/ phenergan to take in conjunction w/ benadryl for headache as well as 800mg  ibuprofen.  Referred to neuro.  Return precautions discussed.         Otilio Miu, PA-C 07/23/12 [phone]

## 2012-07-23 NOTE — ED Notes (Signed)
Pt present to ED c/o a headache that has been going on for 2 months and has increasingly gotten worse. Symptom accompanied with light sensitivity, blurred vision on and off, nausea. Pain rated 7/10.

## 2012-07-25 NOTE — ED Provider Notes (Signed)
Medical screening examination/treatment/procedure(s) were performed by non-physician practitioner and as supervising physician I was immediately available for consultation/collaboration.  Brian Opitz, MD 07/25/12 1031 

## 2012-08-06 ENCOUNTER — Emergency Department (HOSPITAL_COMMUNITY)
Admission: EM | Admit: 2012-08-06 | Discharge: 2012-08-06 | Disposition: A | Discharge status: Home / Self Care | Attending: Emergency Medicine | Admitting: Emergency Medicine

## 2012-08-06 ENCOUNTER — Encounter (HOSPITAL_COMMUNITY): Payer: Self-pay | Admitting: Emergency Medicine

## 2012-08-06 ENCOUNTER — Emergency Department (HOSPITAL_COMMUNITY)

## 2012-08-06 DIAGNOSIS — R51 Headache: Secondary | ICD-10-CM | POA: Insufficient documentation

## 2012-08-06 DIAGNOSIS — M436 Torticollis: Secondary | ICD-10-CM | POA: Insufficient documentation

## 2012-08-06 MED ORDER — IBUPROFEN 800 MG PO TABS: 800.0000 mg | ORAL_TABLET | Freq: Three times a day (TID) | ORAL | Status: DC

## 2012-08-06 MED ORDER — DIAZEPAM 5 MG PO TABS
5.0000 mg | ORAL_TABLET | Freq: Once | ORAL | Status: AC
  Administered 2012-08-06: 5 mg via ORAL
  Filled 2012-08-06: qty 1

## 2012-08-06 MED ORDER — ONDANSETRON 4 MG PO TBDP: 4.0000 mg | ORAL_TABLET | Freq: Once | ORAL | Status: DC

## 2012-08-06 MED ORDER — MORPHINE SULFATE 4 MG/ML IJ SOLN: 4.0000 mg | Freq: Once | INTRAMUSCULAR | Status: DC

## 2012-08-06 MED ORDER — DIAZEPAM 5 MG PO TABS: 5.0000 mg | ORAL_TABLET | Freq: Four times a day (QID) | ORAL | Status: DC | PRN

## 2012-08-06 MED ORDER — IBUPROFEN 200 MG PO TABS
400.0000 mg | ORAL_TABLET | Freq: Once | ORAL | Status: AC
  Administered 2012-08-06: 400 mg via ORAL
  Filled 2012-08-06: qty 2

## 2012-08-06 NOTE — ED Provider Notes (Signed)
Medical screening examination/treatment/procedure(s) were conducted as a shared visit with non-physician practitioner(s) and myself.  I personally evaluated the patient during the encounter  Pt presents with right sided neck pain, worse with movement.  Afebrile, normal neuro exam.  Suspect torticollis, low suspicion for menintgitis, SAH or other emergent cause of pain.    [name], MD 08/06/12 [phone]

## 2012-08-06 NOTE — ED Notes (Signed)
Patient reports gradually worsening neck pain since yesterday.  Patient cannot turn her head.  Patient states that IBU, heat, and Flexeril have not given her any relief.

## 2012-08-06 NOTE — ED Provider Notes (Signed)
History    This chart was scribed for non-physician practitioner working with Lindsey Chick, MD by Lindsey Bryan, ED Scribe. This patient was seen in room WTR6/WTR6 and the patient's care was started at 5:39PM.   CSN: [id number]  Arrival date & time 08/06/12  1721   First MD Initiated Contact with Patient 08/06/12 1739      Chief Complaint  Patient presents with  . Neck Pain    (Consider location/radiation/quality/duration/timing/severity/associated sxs/prior treatment) The history is provided by the patient and a relative. No language interpreter was used.    Lindsey Bryan is a 22 y.o. female , with a hx of headache (intermittently occuring for the past 2 months) and foot surgery, who presents to the Emergency Department complaining of gradual, progressively worsening, non radiating neck pain, located at the right side of the neck, onset yesterday (08/05/12).  Associated symptoms include non radiating headache located at the back of the head. The pt reports she awoke from bed yesterday evening (08/05/12) attempted to turn her head, and experienced a severe neck pain. The pt has taken ibuprofen (800 mg X 1), applied a heat compress, and taken Flexeril, none of which provide relief of the neck pain. Importantly, the pt rates her neck pain at a 5/10 at the present time. Modifying factors include certain movements and positions of the neck which intensifies the neck pain.  The pt denies fever.   The pt does not smoke or drink alcohol.   PCP is Dr. Lottie Bryan   History reviewed. No pertinent past medical history.  Past Surgical History  Procedure Laterality Date  . Foot surgery    . Foot surgery      History reviewed. No pertinent family history.  History  Substance Use Topics  . Smoking status: Never Smoker   . Smokeless tobacco: Not on file  . Alcohol Use: No    OB History   Grav Para Term Preterm Abortions TAB SAB Ect Mult Living                  Review of Systems   Constitutional: Negative for fever.  HENT: Positive for neck pain.   Respiratory: Negative for shortness of breath.   Cardiovascular: Negative for chest pain.  Gastrointestinal: Negative for nausea, vomiting, abdominal pain and diarrhea.  Neurological: Positive for headaches.  All other systems reviewed and are negative.    Allergies  Review of patient's allergies indicates no known allergies.  Home Medications   Current Outpatient Rx  Name  Route  Sig  Dispense  Refill  . cyclobenzaprine (FLEXERIL) 5 MG tablet   Oral   Take 5 mg by mouth 3 (three) times daily as needed for muscle spasms.         . norelgestromin-ethinyl estradiol (ORTHO EVRA) 150-20 MCG/24HR transdermal patch   Transdermal   Place 1 patch onto the skin once a week. On saturdays           BP 106/73  Pulse 98  Temp(Src) 98.7 F (37.1 C) (Oral)  Resp 16  SpO2 99%  LMP 07/20/2012  Physical Exam  Nursing note and vitals reviewed. Constitutional: She is oriented to person, place, and time. She appears well-developed and well-nourished. No distress.  HENT:  Head: Normocephalic.  Mouth/Throat: Oropharynx is clear and moist.  Eyes: Conjunctivae and EOM are normal. Pupils are equal, round, and reactive to light.  Cardiovascular: Normal rate.   Pulmonary/Chest: Effort normal. No stridor.  Musculoskeletal: Normal range of motion.  Neurological:  She is alert and oriented to person, place, and time. No cranial nerve deficit. Coordination normal.  Psychiatric: She has a normal mood and affect.    ED Course  Procedures (including critical care time)  DIAGNOSTIC STUDIES: Oxygen Saturation is 99% on room air, normal by my interpretation.    COORDINATION OF CARE:   5:49 PM- Treatment plan concerning pain management and radiologic evaluation discussed with patient. Pt agrees with treatment.   5:50PM- Neurological exam performed. NML results.  5:57PM- Attending Physician consulted.  6:02PM- Dr.  Jerelyn Scott (attending physician) evaluates pt. Treatment plan concerning follow up with PCP and/or Neurologist with regards to headaches discussed. Pt agrees with treatment.      Labs Reviewed - No data to display No results found.   1. Torticollis       MDM   Neck pain with no fever. Doubt meningitis or subarachnoid. This is a shared visit with attending Dr. Karma Ganja who agrees with care plan and stability for discharge.  Filed Vitals:   08/06/12 1734  BP: 106/73  Pulse: 98  Temp: 98.7 F (37.1 C)  TempSrc: Oral  Resp: 16  SpO2: 99%     Pt verbalized understanding and agrees with care plan. Outpatient follow-up and return precautions given.    Discharge Medication List as of 08/06/2012  6:06 PM    START taking these medications   Details  diazepam (VALIUM) 5 MG tablet Take 1 tablet (5 mg total) by mouth every 6 (six) hours as needed for anxiety (spasms)., Starting 08/06/2012, Until Discontinued, Print        I personally performed the services described in this documentation, which was scribed in my presence. The recorded information has been reviewed and is accurate.   Wynetta Emery, PA-C 08/06/12 2253

## 2012-08-07 ENCOUNTER — Emergency Department (HOSPITAL_COMMUNITY)
Admission: EM | Admit: 2012-08-07 | Discharge: 2012-08-08 | Disposition: A | Discharge status: Home / Self Care | Attending: Emergency Medicine | Admitting: Emergency Medicine

## 2012-08-07 ENCOUNTER — Encounter (HOSPITAL_COMMUNITY): Payer: Self-pay | Admitting: Emergency Medicine

## 2012-08-07 DIAGNOSIS — H538 Other visual disturbances: Secondary | ICD-10-CM | POA: Insufficient documentation

## 2012-08-07 DIAGNOSIS — M25519 Pain in unspecified shoulder: Secondary | ICD-10-CM | POA: Insufficient documentation

## 2012-08-07 DIAGNOSIS — M436 Torticollis: Secondary | ICD-10-CM

## 2012-08-07 DIAGNOSIS — IMO0001 Reserved for inherently not codable concepts without codable children: Secondary | ICD-10-CM | POA: Insufficient documentation

## 2012-08-07 DIAGNOSIS — R51 Headache: Secondary | ICD-10-CM | POA: Insufficient documentation

## 2012-08-07 MED ORDER — HYDROCODONE-ACETAMINOPHEN 5-325 MG PO TABS
1.0000 | ORAL_TABLET | Freq: Once | ORAL | Status: AC
  Administered 2012-08-07: 1 via ORAL
  Filled 2012-08-07: qty 1

## 2012-08-07 MED ORDER — KETOROLAC TROMETHAMINE 60 MG/2ML IM SOLN
60.0000 mg | Freq: Once | INTRAMUSCULAR | Status: AC
  Administered 2012-08-07: 60 mg via INTRAMUSCULAR
  Filled 2012-08-07: qty 2

## 2012-08-07 NOTE — ED Notes (Signed)
Pt presents with c/o neck pain that started the day before yesterday  Pt denies injury Pt states the pain is causing her to have headaches and family states her balance is off

## 2012-08-07 NOTE — ED Provider Notes (Signed)
History    This chart was scribed for non-physician practitioner working with Ward Givens, MD by Leone Payor, ED Scribe. This patient was seen in room WTR8/WTR8 and the patient's care was started at 2123.   CSN: [id number]  Arrival date & time 08/07/12  2123   First MD Initiated Contact with Patient 08/07/12 2146      Chief Complaint  Patient presents with  . Neck Pain     The history is provided by the patient. No language interpreter was used.    Lindsey Bryan is a 22 y.o. female who presents to the Emergency Department complaining of new, constant, gradually worsening neck pain that started 2 days ago. She had pain that started on one side of neck that worsened to both sides starting today. Pt denies injury to the affected area. States the pain is causing associated HA. Pt was seen in the ED yesterday for one-sided neck pain and was given ibuprofen and valium. Pt states she took 1 valium but states it only made her sleepy and did not give any relief from pain. She has used a heating pad and ice packs with minimal relief. Pain is aggravated by movement. Has associated right shoulder pain that radiates up her right neck as well as blurry vision. She denies fever, numbness or tingling in arms or hands, loss of vision. Denies h/o kidney problems or bleeding problems.   Pt denies smoking and alcohol use.  History reviewed. No pertinent past medical history.  Past Surgical History  Procedure Laterality Date  . Foot surgery    . Foot surgery      Family History  Problem Relation Age of Onset  . Asthma Mother   . Diabetes Other   . Stroke Other     History  Substance Use Topics  . Smoking status: Never Smoker   . Smokeless tobacco: Not on file  . Alcohol Use: No    OB History   Grav Para Term Preterm Abortions TAB SAB Ect Mult Living                  Review of Systems  Constitutional: Negative for fever and unexpected weight change.  HENT: Positive for neck pain and  neck stiffness.   Eyes: Negative for visual disturbance.  Gastrointestinal: Negative for constipation.       Negative for fecal incontinence.   Genitourinary: Negative for dysuria, hematuria, flank pain, vaginal bleeding, vaginal discharge and pelvic pain.       Negative for urinary incontinence or retention.  Musculoskeletal: Positive for myalgias and arthralgias (right shoulder pain). Negative for back pain.  Neurological: Positive for headaches. Negative for weakness and numbness.       Denies saddle paresthesias.    Allergies  Diazepam  Home Medications   Current Outpatient Rx  Name  Route  Sig  Dispense  Refill  . ibuprofen (ADVIL,MOTRIN) 200 MG tablet   Oral   Take 400 mg by mouth every 6 (six) hours as needed (pain).         . norelgestromin-ethinyl estradiol (ORTHO EVRA) 150-20 MCG/24HR transdermal patch   Transdermal   Place 1 patch onto the skin once a week. On saturdays           BP 124/82  Pulse 100  Temp(Src) 98.8 F (37.1 C) (Oral)  Resp 18  SpO2 100%  LMP 07/20/2012  Physical Exam  Nursing note and vitals reviewed. Constitutional: She is oriented to person, place, and time. She  appears well-developed and well-nourished. No distress.  HENT:  Head: Normocephalic and atraumatic.  Eyes: Conjunctivae and EOM are normal. Pupils are equal, round, and reactive to light.  Neck: Normal range of motion. Neck supple. No tracheal deviation present.  Spasms on trapezius bilaterally.  Decreased ROM in all six directions of neck. Normal carotid pulses.    Cardiovascular: Normal rate.   Pulmonary/Chest: Effort normal. No respiratory distress.  Abdominal: Soft. There is no tenderness. There is no CVA tenderness.  Musculoskeletal: Normal range of motion.  Normal radial pulses. No upper extremity numbness, tingling, weakness.   Neurological: She is alert and oriented to person, place, and time. She has normal strength and normal reflexes. No cranial nerve deficit or  sensory deficit.  5/5 strength in entire lower extremities bilaterally. No sensation deficit.   Skin: Skin is warm and dry. No rash noted.  Psychiatric: She has a normal mood and affect. Her behavior is normal.    ED Course  Procedures (including critical care time)  DIAGNOSTIC STUDIES: Oxygen Saturation is 100% on room air, normal by my interpretation.    COORDINATION OF CARE: 10:00 PM Discussed treatment plan with pt at bedside and pt agreed to plan.    Labs Reviewed - No data to display No results found.   1. Torticollis    Patient seen and examined. Previous medical records reviewed. Medications ordered.   Vital signs reviewed and are as follows: Filed Vitals:   08/07/12 2128  BP: 124/82  Pulse: 100  Temp: 98.8 F (37.1 C)  Resp: 18   12:05 AM patient states that she is feeling better and can move her neck. Will discharge to home with Vicodin. Told that she can continue Valium if desired.  Patient counseled on use of narcotic pain medications. Counseled not to combine these medications with others containing tylenol. Urged not to drink alcohol, drive, or perform any other activities that requires focus while taking these medications. The patient verbalizes understanding and agrees with the plan.  Urged patient to return with weakness, numbness, or tingling in her upper extremities. Urged return with worsening severe headache, altered level consciousness, fever. Urged to return with worsening severe neck pain or loss of vision.  Patient verbalizes understanding and agrees with plan.    MDM  Patient with neck pain with palpable spasm of her trapezius muscles bilaterally. She does not have any upper extremity neurological symptoms present to suggest cervical radiculopathy. She does not have fever or meningismus to suggest meningitis. She does not have vision change or loss to suggest arterial or vertebral artery dissection. Patient improved after treatment in emergency  department.     I personally performed the services described in this documentation, which was scribed in my presence. The recorded information has been reviewed and is accurate.   Renne Crigler, PA-C 08/08/12 [phone]

## 2012-08-08 MED ORDER — HYDROCODONE-ACETAMINOPHEN 5-325 MG PO TABS: ORAL_TABLET | ORAL | Status: DC

## 2012-08-10 NOTE — ED Provider Notes (Signed)
Medical screening examination/treatment/procedure(s) were performed by non-physician practitioner and as supervising physician I was immediately available for consultation/collaboration. Devoria Albe, MD, FACEP   Ward Givens, MD 08/10/12 0900

## 2012-09-13 ENCOUNTER — Emergency Department (HOSPITAL_COMMUNITY)
Admission: EM | Admit: 2012-09-13 | Discharge: 2012-09-13 | Disposition: A | Discharge status: Home / Self Care | Attending: Emergency Medicine | Admitting: Emergency Medicine

## 2012-09-13 ENCOUNTER — Encounter (HOSPITAL_COMMUNITY): Payer: Self-pay | Admitting: *Deleted

## 2012-09-13 DIAGNOSIS — M62838 Other muscle spasm: Secondary | ICD-10-CM | POA: Insufficient documentation

## 2012-09-13 DIAGNOSIS — R42 Dizziness and giddiness: Secondary | ICD-10-CM | POA: Insufficient documentation

## 2012-09-13 DIAGNOSIS — R51 Headache: Secondary | ICD-10-CM | POA: Insufficient documentation

## 2012-09-13 DIAGNOSIS — H53149 Visual discomfort, unspecified: Secondary | ICD-10-CM | POA: Insufficient documentation

## 2012-09-13 DIAGNOSIS — H539 Unspecified visual disturbance: Secondary | ICD-10-CM | POA: Insufficient documentation

## 2012-09-13 HISTORY — DX: Headache: R51

## 2012-09-13 MED ORDER — KETOROLAC TROMETHAMINE 60 MG/2ML IM SOLN
60.0000 mg | Freq: Once | INTRAMUSCULAR | Status: AC
  Administered 2012-09-13: 60 mg via INTRAMUSCULAR
  Filled 2012-09-13: qty 2

## 2012-09-13 MED ORDER — DEXAMETHASONE SODIUM PHOSPHATE 10 MG/ML IJ SOLN
10.0000 mg | Freq: Once | INTRAMUSCULAR | Status: AC
  Administered 2012-09-13: 10 mg via INTRAMUSCULAR
  Filled 2012-09-13: qty 1

## 2012-09-13 MED ORDER — METHOCARBAMOL 500 MG PO TABS: 500.0000 mg | ORAL_TABLET | Freq: Two times a day (BID) | ORAL | Status: DC | PRN

## 2012-09-13 NOTE — ED Notes (Addendum)
Pt reports hx of headaches/migraines. Seen in ED 3/12, 3/26, and 3/27 for same complaint. Neck pain and headache with eye pain. Pain 10/10.

## 2012-09-13 NOTE — ED Provider Notes (Signed)
History  This chart was scribed for non-physician practitioner working with Lindsey Sprout, MD by Ardeen Jourdain, ED Scribe. This patient was seen in room WTR6/WTR6 and the patient's care was started at 1857.  CSN: [id number]  Arrival date & time 09/13/12  1851   First MD Initiated Contact with Patient 09/13/12 1857      Chief Complaint  Patient presents with  . Migraine     The history is provided by the patient. No language interpreter was used.    Lindsey Bryan is a 22 y.o. female with a h/o HA who presents to the Emergency Department complaining of gradual onset, gradually worsening, constant HA with associated right sided neck pain, photophobia. She locates the HA to her forehead and around both eyes.  Headache without associated phonophobia or aura. Describes neck pain as a sharp sensation and tightness that is aggravated by movement.  She states these symptoms have been occuring daily since 2/14.  No recent injury or trauma.  Also endorses recent onset of blurred vision and dizziness upon standing or abrupt movements.  She denies fever, nausea or emesis as associated symptoms. Pt states she has taken her prescribed Fioricet, Topamax and Maxalt without relief.  Does not wear glasses or contact lenses.  Last eye exam several years ago.  Past Medical History  Diagnosis Date  . Headache     Past Surgical History  Procedure Laterality Date  . Foot surgery    . Foot surgery      Family History  Problem Relation Age of Onset  . Asthma Mother   . Diabetes Other   . Stroke Other     History  Substance Use Topics  . Smoking status: Never Smoker   . Smokeless tobacco: Not on file  . Alcohol Use: No   No OB history available.   Review of Systems  HENT: Positive for neck pain.   Neurological: Positive for headaches.  All other systems reviewed and are negative.    Allergies  Diazepam  Home Medications   Current Outpatient Rx  Name  Route  Sig  Dispense  Refill   . HYDROcodone-acetaminophen (NORCO/VICODIN) 5-325 MG per tablet      Take 1-2 tablets every 6 hours as needed for severe pain   8 tablet   0   . ibuprofen (ADVIL,MOTRIN) 200 MG tablet   Oral   Take 400 mg by mouth every 6 (six) hours as needed (pain).         . norelgestromin-ethinyl estradiol (ORTHO EVRA) 150-20 MCG/24HR transdermal patch   Transdermal   Place 1 patch onto the skin once a week. On saturdays           Triage Vitals: BP 117/76  Pulse 95  Temp(Src) 99 F (37.2 C) (Oral)  Resp 20  SpO2 100%  Physical Exam  Nursing note and vitals reviewed. Constitutional: She is oriented to person, place, and time. She appears well-developed and well-nourished.  HENT:  Head: Normocephalic and atraumatic.  Mouth/Throat: Oropharynx is clear and moist.  Eyes: Conjunctivae and EOM are normal. Pupils are equal, round, and reactive to light. Right eye exhibits no nystagmus. Left eye exhibits no nystagmus.  Neck: Normal range of motion. Neck supple. Muscular tenderness present. No spinous process tenderness present. No rigidity. Normal range of motion present.  No meningeal signs  Cardiovascular: Normal rate, regular rhythm and normal heart sounds.   Pulmonary/Chest: Effort normal and breath sounds normal.  Musculoskeletal: Normal range of motion.  Neurological: She  is alert and oriented to person, place, and time. She has normal strength. No cranial nerve deficit or sensory deficit.  Skin: Skin is warm and dry.  Psychiatric: She has a normal mood and affect.    ED Course  Procedures (including critical care time)  DIAGNOSTIC STUDIES: Oxygen Saturation is 100% on room air, normal by my interpretation.    COORDINATION OF CARE:  7:11 PM-Discussed treatment plan which includes anti-migraine medication with pt at bedside and pt agreed to plan.    Labs Reviewed - No data to display No results found.   1. Headache around the eyes   2. Muscle spasms of neck       MDM    Patient presenting to the ED for recurrent migraine headache and right-sided neck tension. Patient recently started on Maxalt, Topamax, and Fioricet. Has taken all of these medications without relief of symptoms.  Dizziness occurs with standing and abrupt movement-this is likely due recent initiation of Topamax therapy (postural hypotension).  Pt afebrile, no meningeal signs, no neuro deficits, or AMS.  Good relief of headache with IM Toradol and Decadron.  The patient will be referred to neurology, Dr. Roseanne Reno, for further evaluation of her headaches. Followup with ophthalmology, Dr. Vonna Kotyk, for eye exam.  Consider cutting Topamax in half if it continues to make her dizzy. Discussed plan with patient, she agreed. Return if worsens advised.   I personally performed the services described in this documentation, which was scribed in my presence. The recorded information has been reviewed and is accurate.    Garlon Hatchet, PA-C 09/13/12 2311  Garlon Hatchet, PA-C 09/13/12 [phone]

## 2012-09-13 NOTE — ED Provider Notes (Signed)
Medical screening examination/treatment/procedure(s) were performed by non-physician practitioner and as supervising physician I was immediately available for consultation/collaboration.   Gwyneth Sprout, MD 09/13/12 2320

## 2012-10-25 IMAGING — CR DG FOOT COMPLETE 3+V*L*
3 series · 3 of 3 positions shown · non-contrast
Comparison: 03/07/2011

CLINICAL DATA: Medial left-sided foot pain

LEFT FOOT - COMPLETE 3+ VIEW

[x foot ap left]
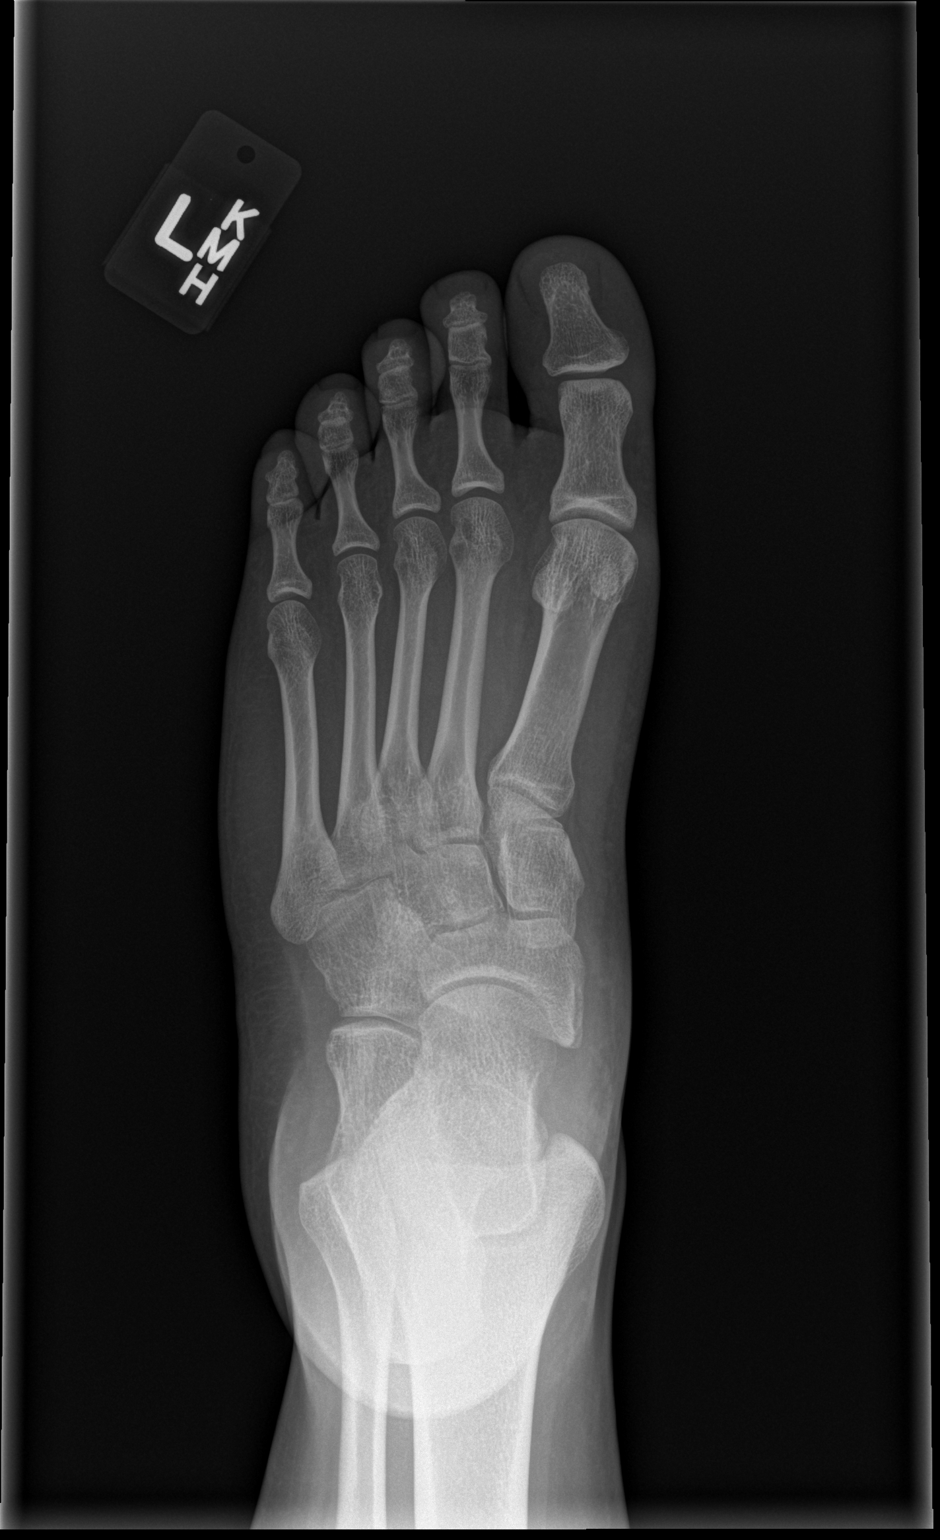

[x foot obl left]
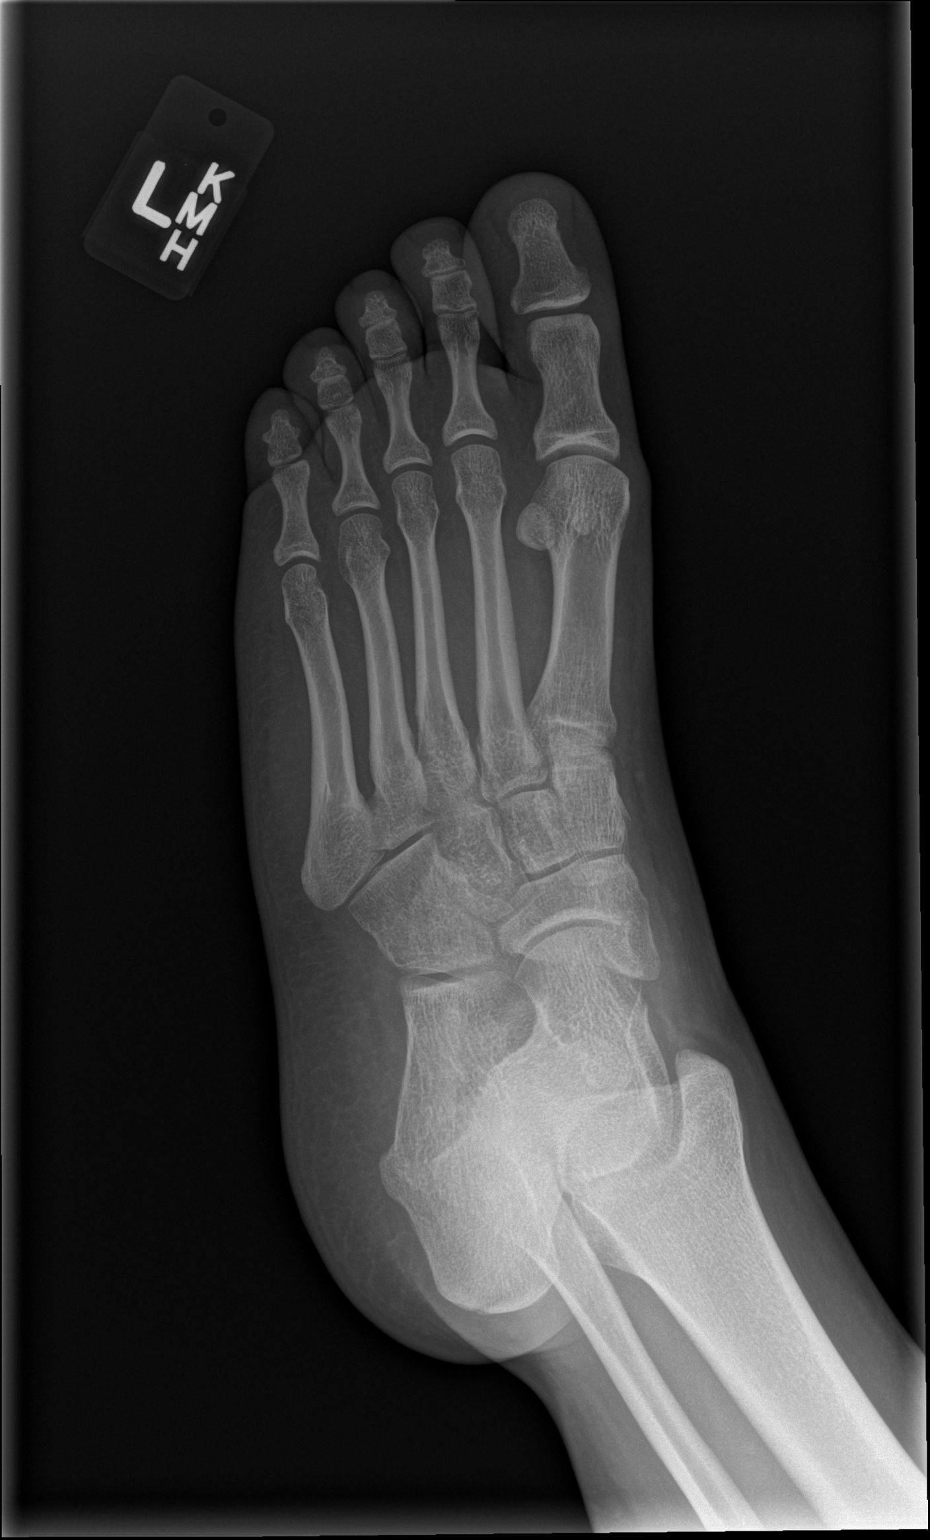

[x foot lat left]
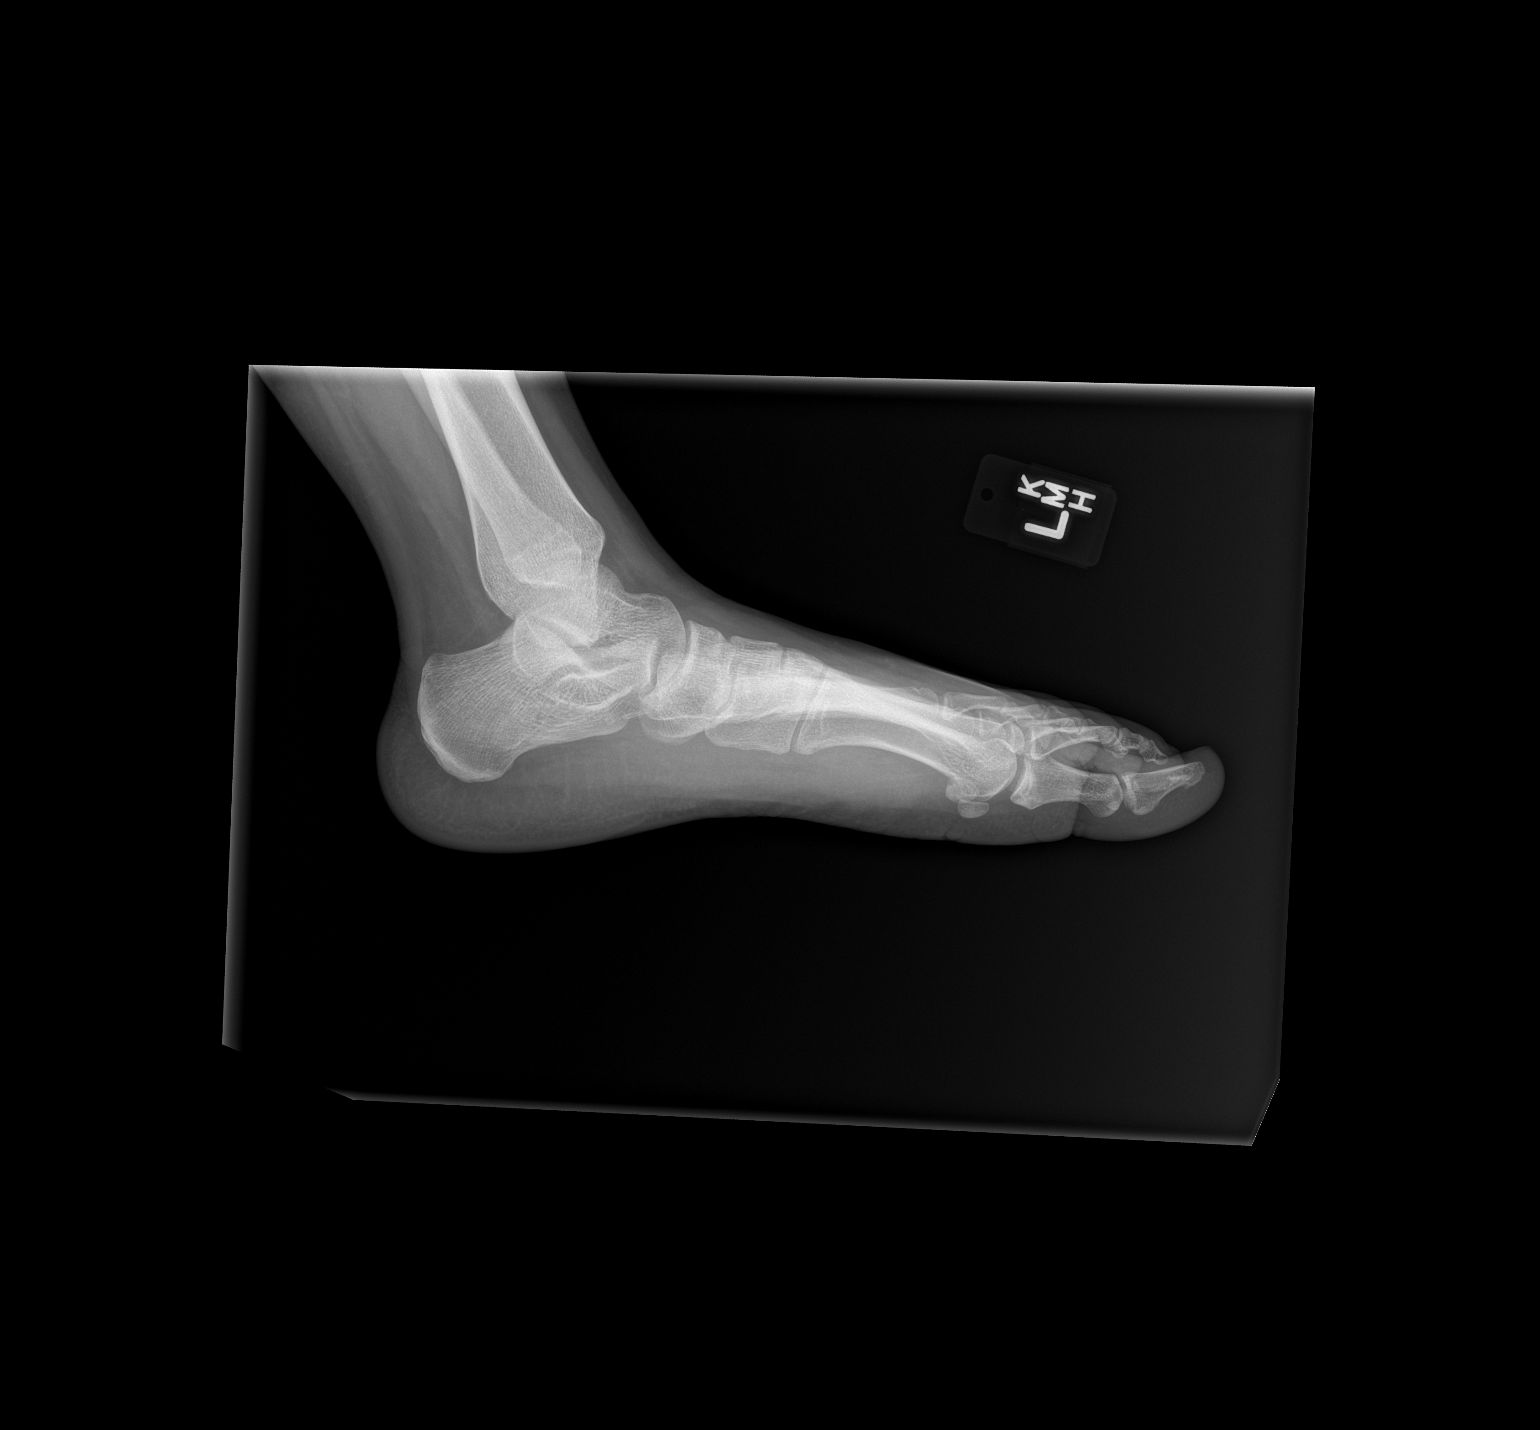

[3 of 3 positions shown; findings below may reference images not displayed]

FINDINGS: No fracture or dislocation.  Joint spaces are preserved.
No erosions.  Pes planus deformity is suspected.  Regional soft
tissues are normal.
IMPRESSION: 1.  No fracture or dislocation.
2.  Pes planus deformity.

## 2013-01-05 ENCOUNTER — Emergency Department (HOSPITAL_COMMUNITY)
Admission: EM | Admit: 2013-01-05 | Discharge: 2013-01-05 | Disposition: A | Discharge status: Home / Self Care | Attending: Emergency Medicine | Admitting: Emergency Medicine

## 2013-01-05 ENCOUNTER — Encounter (HOSPITAL_COMMUNITY): Payer: Self-pay | Admitting: Emergency Medicine

## 2013-01-05 DIAGNOSIS — G43909 Migraine, unspecified, not intractable, without status migrainosus: Secondary | ICD-10-CM | POA: Insufficient documentation

## 2013-01-05 DIAGNOSIS — Z79899 Other long term (current) drug therapy: Secondary | ICD-10-CM | POA: Insufficient documentation

## 2013-01-05 HISTORY — DX: Migraine, unspecified, not intractable, without status migrainosus: G43.909

## 2013-01-05 MED ORDER — DIPHENHYDRAMINE HCL 50 MG/ML IJ SOLN
25.0000 mg | Freq: Once | INTRAMUSCULAR | Status: AC
  Administered 2013-01-05: 25 mg via INTRAVENOUS
  Filled 2013-01-05: qty 1

## 2013-01-05 MED ORDER — DEXAMETHASONE SODIUM PHOSPHATE 10 MG/ML IJ SOLN
10.0000 mg | Freq: Once | INTRAMUSCULAR | Status: AC
  Administered 2013-01-05: 10 mg via INTRAVENOUS
  Filled 2013-01-05: qty 1

## 2013-01-05 MED ORDER — SODIUM CHLORIDE 0.9 % IV BOLUS (SEPSIS)
1000.0000 mL | Freq: Once | INTRAVENOUS | Status: AC
  Administered 2013-01-05: 1000 mL via INTRAVENOUS

## 2013-01-05 MED ORDER — METOCLOPRAMIDE HCL 5 MG/ML IJ SOLN
10.0000 mg | Freq: Once | INTRAMUSCULAR | Status: AC
  Administered 2013-01-05: 10 mg via INTRAVENOUS
  Filled 2013-01-05: qty 2

## 2013-01-05 MED ORDER — KETOROLAC TROMETHAMINE 30 MG/ML IJ SOLN
30.0000 mg | Freq: Once | INTRAMUSCULAR | Status: AC
  Administered 2013-01-05: 30 mg via INTRAVENOUS
  Filled 2013-01-05: qty 1

## 2013-01-05 NOTE — ED Notes (Signed)
Pt presents to the Ed with a complaint of a migraine headache.  Onset of this was yesterday.  Pt has a history of migraines and has prescriptions for said.  Pt stopped taking preventative due to it lowering her BP too much.  Pt is out of her medications for ongoing migraines.

## 2013-01-05 NOTE — ED Provider Notes (Signed)
CSN: [id number]     Arrival date & time 01/05/13  2127 History   First MD Initiated Contact with Patient 01/05/13 2203     Chief Complaint  Patient presents with  . Migraine   (Consider location/radiation/quality/duration/timing/severity/associated sxs/prior Treatment) HPI Comments: Migraine that started today. No fevers. Hx of similar.  Worse with sounds, light.  Patient is a 22 y.o. female presenting with migraines. The history is provided by the patient.  Migraine This is a recurrent problem. The current episode started yesterday. The problem occurs constantly. The problem has not changed since onset.Associated symptoms include headaches. Pertinent negatives include no chest pain, no abdominal pain and no shortness of breath. Nothing aggravates the symptoms. Nothing relieves the symptoms. She has tried nothing for the symptoms. The treatment provided no relief.    Past Medical History  Diagnosis Date  . Headache(784.0)   . Migraines    Past Surgical History  Procedure Laterality Date  . Foot surgery    . Foot surgery     Family History  Problem Relation Age of Onset  . Asthma Mother   . Diabetes Other   . Stroke Other    History  Substance Use Topics  . Smoking status: Never Smoker   . Smokeless tobacco: Not on file  . Alcohol Use: No   OB History   Grav Para Term Preterm Abortions TAB SAB Ect Mult Living                 Review of Systems  Constitutional: Negative for fever and chills.  Respiratory: Negative for shortness of breath.   Cardiovascular: Negative for chest pain.  Gastrointestinal: Negative for abdominal pain.  Neurological: Positive for headaches.  All other systems reviewed and are negative.    Allergies  Diazepam  Home Medications   Current Outpatient Rx  Name  Route  Sig  Dispense  Refill  . butalbital-acetaminophen-caffeine (FIORICET WITH CODEINE) 50-325-40-30 MG per capsule   Oral   Take 1 capsule by mouth every 4 (four) hours as  needed for headache.         . ibuprofen (ADVIL,MOTRIN) 200 MG tablet   Oral   Take 600 mg by mouth every 8 (eight) hours as needed for pain.         Marland Kitchen norelgestromin-ethinyl estradiol (ORTHO EVRA) 150-20 MCG/24HR transdermal patch   Transdermal   Place 1 patch onto the skin once a week. On saturdays         . rizatriptan (MAXALT) 10 MG tablet   Oral   Take 10 mg by mouth as needed for migraine. May repeat in 2 hours if needed         . topiramate (TOPAMAX) 25 MG tablet   Oral   Take 25 mg by mouth daily.          BP 122/81  Pulse 98  Temp(Src) 98.6 F (37 C) (Oral)  Resp 18  Ht 5\' 3"  (1.6 m)  Wt 198 lb (89.812 kg)  BMI 35.08 kg/m2  SpO2 99%  LMP 12/23/2012 Physical Exam  Nursing note and vitals reviewed. Constitutional: She is oriented to person, place, and time. She appears well-developed and well-nourished. No distress.  HENT:  Head: Normocephalic and atraumatic.  Eyes: EOM are normal. Pupils are equal, round, and reactive to light.  Neck: Normal range of motion. Neck supple.  Cardiovascular: Normal rate and regular rhythm.  Exam reveals no friction rub.   No murmur heard. Pulmonary/Chest: Effort normal and breath sounds  normal. No respiratory distress. She has no wheezes. She has no rales.  Abdominal: Soft. She exhibits no distension. There is no tenderness. There is no rebound.  Musculoskeletal: Normal range of motion. She exhibits no edema.  Neurological: She is alert and oriented to person, place, and time. No cranial nerve deficit. She exhibits normal muscle tone. Coordination normal.  Skin: She is not diaphoretic.    ED Course  Procedures (including critical care time) Labs Review Labs Reviewed - No data to display Imaging Review No results found.  MDM   1. Migraine    [age]-year-old female with history of migraines presents with a migraine. Similar to previous migraines. Denies any fevers, blurry vision, nausea or vomiting. She is dizzy. She  states she gets dizzy with her migraines normally. Here vitals are stable neuro exam is normal. Will give migraine cocktail. On re-exam, headache gone. Easily ambulatory. Stable for discharge.     Dagmar Hait, MD 01/05/13 [phone]

## 2014-12-18 ENCOUNTER — Emergency Department (HOSPITAL_COMMUNITY)
Admission: EM | Admit: 2014-12-18 | Discharge: 2014-12-19 | Disposition: A | Discharge status: Home / Self Care | Attending: Emergency Medicine | Admitting: Emergency Medicine

## 2014-12-18 DIAGNOSIS — O210 Mild hyperemesis gravidarum: Secondary | ICD-10-CM | POA: Insufficient documentation

## 2014-12-18 DIAGNOSIS — Z79899 Other long term (current) drug therapy: Secondary | ICD-10-CM | POA: Insufficient documentation

## 2014-12-18 DIAGNOSIS — O9989 Other specified diseases and conditions complicating pregnancy, childbirth and the puerperium: Secondary | ICD-10-CM | POA: Insufficient documentation

## 2014-12-18 DIAGNOSIS — Z3A01 Less than 8 weeks gestation of pregnancy: Secondary | ICD-10-CM | POA: Insufficient documentation

## 2014-12-18 DIAGNOSIS — O2341 Unspecified infection of urinary tract in pregnancy, first trimester: Secondary | ICD-10-CM | POA: Insufficient documentation

## 2014-12-18 DIAGNOSIS — K5900 Constipation, unspecified: Secondary | ICD-10-CM | POA: Insufficient documentation

## 2014-12-18 DIAGNOSIS — R1084 Generalized abdominal pain: Secondary | ICD-10-CM | POA: Insufficient documentation

## 2014-12-18 DIAGNOSIS — G43909 Migraine, unspecified, not intractable, without status migrainosus: Secondary | ICD-10-CM | POA: Insufficient documentation

## 2014-12-18 DIAGNOSIS — R109 Unspecified abdominal pain: Secondary | ICD-10-CM

## 2014-12-18 DIAGNOSIS — O209 Hemorrhage in early pregnancy, unspecified: Secondary | ICD-10-CM | POA: Insufficient documentation

## 2014-12-18 DIAGNOSIS — O99411 Diseases of the circulatory system complicating pregnancy, first trimester: Secondary | ICD-10-CM | POA: Insufficient documentation

## 2014-12-18 DIAGNOSIS — O26899 Other specified pregnancy related conditions, unspecified trimester: Secondary | ICD-10-CM

## 2014-12-18 LAB — CBC
HCT: 36.2 % (ref 36.0–46.0)
Hemoglobin: 11.8 g/dL — ABNORMAL LOW (ref 12.0–15.0)
MCH: 23.3 pg — AB (ref 26.0–34.0)
MCHC: 32.6 g/dL (ref 30.0–36.0)
MCV: 71.4 fL — ABNORMAL LOW (ref 78.0–100.0)
Platelets: 416 10*3/uL — ABNORMAL HIGH (ref 150–400)
RBC: 5.07 MIL/uL (ref 3.87–5.11)
RDW: 14.2 % (ref 11.5–15.5)
WBC: 10.8 10*3/uL — ABNORMAL HIGH (ref 4.0–10.5)

## 2014-12-18 LAB — COMPREHENSIVE METABOLIC PANEL
ALBUMIN: 4.3 g/dL (ref 3.5–5.0)
ALT: 13 U/L — ABNORMAL LOW (ref 14–54)
ANION GAP: 8 (ref 5–15)
AST: 16 U/L (ref 15–41)
Alkaline Phosphatase: 64 U/L (ref 38–126)
BILIRUBIN TOTAL: 0.3 mg/dL (ref 0.3–1.2)
BUN: 6 mg/dL (ref 6–20)
CHLORIDE: 104 mmol/L (ref 101–111)
CO2: 23 mmol/L (ref 22–32)
Calcium: 9.1 mg/dL (ref 8.9–10.3)
Creatinine, Ser: 0.57 mg/dL (ref 0.44–1.00)
GFR calc Af Amer: >60 mL/min (ref >60)
GFR calc non Af Amer: >60 mL/min (ref >60)
GLUCOSE: 87 mg/dL (ref 65–99)
POTASSIUM: 3.4 mmol/L — AB (ref 3.5–5.1)
SODIUM: 135 mmol/L (ref 135–145)
TOTAL PROTEIN: 7.7 g/dL (ref 6.5–8.1)

## 2014-12-18 LAB — LIPASE, BLOOD: LIPASE: 18 U/L — AB (ref 22–51)

## 2014-12-18 LAB — HCG, QUANTITATIVE, PREGNANCY: hCG, Beta Chain, Quant, S: 37010 m[IU]/mL — ABNORMAL HIGH (ref <5)

## 2014-12-18 MED ORDER — PYRIDOXINE HCL 100 MG/ML IJ SOLN
100.0000 mg | Freq: Once | INTRAMUSCULAR | Status: AC
  Administered 2014-12-18: 100 mg via INTRAVENOUS
  Filled 2014-12-18: qty 1

## 2014-12-18 MED ORDER — SODIUM CHLORIDE 0.9 % IV BOLUS (SEPSIS)
1000.0000 mL | Freq: Once | INTRAVENOUS | Status: AC
  Administered 2014-12-18: 1000 mL via INTRAVENOUS

## 2014-12-18 NOTE — ED Notes (Signed)
Pt arrived to the ED with a complaint of abdominal pain and emesis.  Symptoms have been present for four days.  Pt states he can't keep anything down on her stomach.  Pt thought she was going to have her period but she just spotted and then stopped.  Pt states pain is located mid medial abdomen and lower medial abdomen.  Pain is a throbbing that become sharp cramps.

## 2014-12-18 NOTE — ED Provider Notes (Signed)
CSN: [id number]     Arrival date & time 12/18/14  2049 History  This chart was scribed for non-physician practitioner Fayrene Helper, PA-C, working with April Palumbo, MD, by Andrew Au, ED Scribe. This patient was seen in room WA13/WA13 and the patient's care was started at 11:18 PM.   Chief Complaint  Patient presents with  . Abdominal Pain  . Emesis   The history is provided by the patient. No language interpreter was used.   Lindsey Bryan is a 24 y.o. female who presents to the Emergency Department complaining of nausea for 4 days. Pt reports associated emesis and is having difficulty keeping food down, mild abdominal pain but unable to localized pain. slight vaginal spotting x 2 days and constipation for couples day. She usually has a BM every other day. Pt has recently found out she was pregnant with her first child.  States this is an unexpected pregnancy. LMP 5/15. She denies fever, chills, CP, SOB and vaginal discharge. Sexually active with one partner, no prior hx of STD.    Past Medical History  Diagnosis Date  . Headache(784.0)   . Migraines    Past Surgical History  Procedure Laterality Date  . Foot surgery    . Foot surgery     Family History  Problem Relation Age of Onset  . Asthma Mother   . Diabetes Other   . Stroke Other    History  Substance Use Topics  . Smoking status: Never Smoker   . Smokeless tobacco: Not on file  . Alcohol Use: No   OB History    No data available     Review of Systems  Constitutional: Negative for fever and chills.  HENT: Negative for rhinorrhea.   Respiratory: Negative for cough and shortness of breath.   Cardiovascular: Negative for chest pain.  Gastrointestinal: Positive for nausea, vomiting, abdominal pain and constipation.  Genitourinary: Positive for vaginal bleeding. Negative for vaginal discharge.    Allergies  Diazepam  Home Medications   Prior to Admission medications   Medication Sig Start Date End Date Taking?  Authorizing Provider  butalbital-acetaminophen-caffeine (FIORICET WITH CODEINE) 50-325-40-30 MG per capsule Take 1 capsule by mouth every 4 (four) hours as needed for headache.    Historical Provider, MD  ibuprofen (ADVIL,MOTRIN) 200 MG tablet Take 600 mg by mouth every 8 (eight) hours as needed for pain.    Historical Provider, MD  norelgestromin-ethinyl estradiol (ORTHO EVRA) 150-20 MCG/24HR transdermal patch Place 1 patch onto the skin once a week. On saturdays    Historical Provider, MD  rizatriptan (MAXALT) 10 MG tablet Take 10 mg by mouth as needed for migraine. May repeat in 2 hours if needed    Historical Provider, MD  topiramate (TOPAMAX) 25 MG tablet Take 25 mg by mouth daily.    Historical Provider, MD   BP 126/76 mmHg  Pulse 89  Temp(Src) 98.6 F (37 C) (Oral)  Resp 18  Ht 5\' 3"  (1.6 m)  Wt 202 lb (91.627 kg)  BMI 35.79 kg/m2  SpO2 100%  LMP 11/17/2014  Physical Exam  Constitutional: She is oriented to person, place, and time. She appears well-developed and well-nourished. No distress.  HENT:  Head: Normocephalic and atraumatic.  Eyes: Conjunctivae and EOM are normal.  Neck: Neck supple.  Cardiovascular: Normal rate.   Pulmonary/Chest: Effort normal.  Abdominal: Soft. Bowel sounds are normal. She exhibits no distension. There is generalized tenderness. There is no rebound and no guarding.  Genitourinary:  Chaperone present  during exam. No inguinal lymphadenopathy or inguinal hernia noted. Normal external genitalis. No discomfort with speculum insertion. Normal vaginal vault free of discharge. Closed cervical os with trace of blood noted. On bimanual examination, no adnexal tenderness or cervical motion tenderness. No mass.  Musculoskeletal: Normal range of motion.  Neurological: She is alert and oriented to person, place, and time.  Skin: Skin is warm and dry.  Psychiatric: She has a normal mood and affect. Her behavior is normal.  Nursing note and vitals  reviewed.   ED Course  Procedures (including critical care time) DIAGNOSTIC STUDIES: Oxygen Saturation is 100% on RA, normal by my interpretation.    COORDINATION OF CARE: 11:17 PM-  Patient presents with primary complaint of nausea and occasional vomiting. She also endorsed mild vaginal spotting. She is found to be pregnant. Workup initiated. Pt advised of plan for treatment and pt agrees.  2:29 AM Transvaginal ultrasound demonstrates a single living intrauterine gestation measuring 5 weeks 6 days by crown-rump length. No concerning finding noted. Patient's urine shown evidence of the year tract infection. Rocephin via IV given. Urine culture sent. Patient will be sent home with Keflex for further treatment of urinary tract infection. She will need to follow-up with [facility] for further management of her pregnancy. Otherwise patient is stable for discharge. Plan to prescribe phenergan to help with nausea.    Labs Review Labs Reviewed  WET PREP, GENITAL - Abnormal; Notable for the following:    Clue Cells Wet Prep HPF POC FEW (*)    WBC, Wet Prep HPF POC FEW (*)    All other components within normal limits  LIPASE, BLOOD - Abnormal; Notable for the following:    Lipase 18 (*)    All other components within normal limits  COMPREHENSIVE METABOLIC PANEL - Abnormal; Notable for the following:    Potassium 3.4 (*)    ALT 13 (*)    All other components within normal limits  CBC - Abnormal; Notable for the following:    WBC 10.8 (*)    Hemoglobin 11.8 (*)    MCV 71.4 (*)    MCH 23.3 (*)    Platelets 416 (*)    All other components within normal limits  URINALYSIS, ROUTINE W REFLEX MICROSCOPIC (NOT AT [facility]) - Abnormal; Notable for the following:    APPearance CLOUDY (*)    Ketones, ur >80 (*)    Nitrite POSITIVE (*)    All other components within normal limits  HCG, QUANTITATIVE, PREGNANCY - Abnormal; Notable for the following:    hCG, Beta Chain, Quant, S 37010 (*)    All  other components within normal limits  URINE MICROSCOPIC-ADD ON - Abnormal; Notable for the following:    Bacteria, UA MANY (*)    All other components within normal limits  URINE CULTURE  RPR  HIV ANTIBODY (ROUTINE TESTING)  ABO/RH  GC/CHLAMYDIA PROBE AMP ([place]) NOT AT [facility]    Imaging Review US Ob Comp Less 14 Wks  12/19/2014   CLINICAL DATA:  Abdomen pain  EXAM: OBSTETRIC <14 WK [place] AND TRANSVAGINAL OB US  TECHNIQUE: Both transabdominal and transvaginal ultrasound examinations were performed for complete evaluation of the gestation as well as the maternal uterus, adnexal regions, and pelvic cul-de-sac. Transvaginal technique was performed to assess early pregnancy.  COMPARISON:  None.  FINDINGS: Intrauterine gestational sac: Single  Yolk sac:  Yes  Embryo:  Yes  Cardiac Activity: Yes  Heart Rate: 112  bpm  CRL:  3.1  mm   5 w   6 d                  [place] EDC: 08/15/2015  Maternal uterus/adnexae: No significant abnormality  IMPRESSION: Single living intrauterine gestation measuring 5 weeks 6 days by crown-rump length.   Electronically Signed   By: Ellery Plunk M.D.   On: 12/19/2014 02:12   US Ob Transvaginal  12/19/2014   CLINICAL DATA:  Abdomen pain  EXAM: OBSTETRIC <14 WK [place] AND TRANSVAGINAL OB US  TECHNIQUE: Both transabdominal and transvaginal ultrasound examinations were performed for complete evaluation of the gestation as well as the maternal uterus, adnexal regions, and pelvic cul-de-sac. Transvaginal technique was performed to assess early pregnancy.  COMPARISON:  None.  FINDINGS: Intrauterine gestational sac: Single  Yolk sac:  Yes  Embryo:  Yes  Cardiac Activity: Yes  Heart Rate: 112  bpm  CRL:  3.1  mm   5 w   6 d                  [place] EDC: 08/15/2015  Maternal uterus/adnexae: No significant abnormality  IMPRESSION: Single living intrauterine gestation measuring 5 weeks 6 days by crown-rump length.   Electronically Signed   By: Ellery Plunk M.D.   On: 12/19/2014 02:12     EKG  Interpretation None      MDM   Final diagnoses:  Abdominal pain complicating pregnancy  UTI (urinary tract infection) in pregnancy in first trimester    BP 122/79 mmHg  Pulse 73  Temp(Src) 98.6 F (37 C) (Oral)  Resp 18  Ht  (1.6 m)  Wt 202 lb (91.627 kg)  BMI 35.79 kg/m2  SpO2 100%  LMP 11/17/2014  I have reviewed nursing notes and vital signs. I personally viewed the imaging tests through PACS system and agrees with radiologist's intepretation I reviewed available ER/hospitalization records through the EMR  I personally performed the services described in this documentation, which was scribed in my presence. The recorded information has been reviewed and is accurate.     Fayrene Helper, PA-C 12/19/14 0246  Cy Blamer, MD 12/19/14 [phone]

## 2014-12-19 ENCOUNTER — Emergency Department (HOSPITAL_COMMUNITY)

## 2014-12-19 LAB — URINE MICROSCOPIC-ADD ON

## 2014-12-19 LAB — URINALYSIS, ROUTINE W REFLEX MICROSCOPIC
BILIRUBIN URINE: NEGATIVE
GLUCOSE, UA: NEGATIVE mg/dL
HGB URINE DIPSTICK: NEGATIVE
Ketones, ur: >80 mg/dL — AB
Leukocytes, UA: NEGATIVE
NITRITE: POSITIVE — AB
PH: 6.5 (ref 5.0–8.0)
Protein, ur: NEGATIVE mg/dL
SPECIFIC GRAVITY, URINE: 1.03 (ref 1.005–1.030)
Urobilinogen, UA: 1 mg/dL (ref 0.0–1.0)

## 2014-12-19 LAB — WET PREP, GENITAL
Trich, Wet Prep: NONE SEEN
Yeast Wet Prep HPF POC: NONE SEEN

## 2014-12-19 LAB — HIV ANTIBODY (ROUTINE TESTING W REFLEX): HIV SCREEN 4TH GENERATION: NONREACTIVE

## 2014-12-19 LAB — ABO/RH: ABO/RH(D): O POS

## 2014-12-19 LAB — RPR: RPR Ser Ql: NONREACTIVE

## 2014-12-19 MED ORDER — PROMETHAZINE HCL 25 MG/ML IJ SOLN
25.0000 mg | Freq: Once | INTRAMUSCULAR | Status: AC
  Administered 2014-12-19: 25 mg via INTRAVENOUS
  Filled 2014-12-19: qty 1

## 2014-12-19 MED ORDER — PROMETHAZINE HCL 25 MG PO TABS: 25.0000 mg | ORAL_TABLET | Freq: Four times a day (QID) | ORAL | Status: DC | PRN

## 2014-12-19 MED ORDER — DEXTROSE 5 % IV SOLN
1.0000 g | Freq: Once | INTRAVENOUS | Status: AC
  Administered 2014-12-19: 1 g via INTRAVENOUS
  Filled 2014-12-19: qty 10

## 2014-12-19 MED ORDER — CEPHALEXIN 500 MG PO CAPS: 1000.0000 mg | ORAL_CAPSULE | Freq: Two times a day (BID) | ORAL | Status: DC

## 2014-12-19 NOTE — Discharge Instructions (Signed)
You are 5 weeks and 6 days pregnant. You also have signs of urinary tract infection.  Take Keflex antibiotic for the full duration.  Take phenergan as needed for nausea.  Follow up at [facility] for further care of your pregnancy.  Abdominal Pain During Pregnancy Abdominal pain is common in pregnancy. Most of the time, it does not cause harm. There are many causes of abdominal pain. Some causes are more serious than others. Some of the causes of abdominal pain in pregnancy are easily diagnosed. Occasionally, the diagnosis takes time to understand. Other times, the cause is not determined. Abdominal pain can be a sign that something is very wrong with the pregnancy, or the pain may have nothing to do with the pregnancy at all. For this reason, always tell your health care provider if you have any abdominal discomfort. HOME CARE INSTRUCTIONS  Monitor your abdominal pain for any changes. The following actions may help to alleviate any discomfort you are experiencing:  Do not have sexual intercourse or put anything in your vagina until your symptoms go away completely.  Get plenty of rest until your pain improves.  Drink clear fluids if you feel nauseous. Avoid solid food as long as you are uncomfortable or nauseous.  Only take over-the-counter or prescription medicine as directed by your health care provider.  Keep all follow-up appointments with your health care provider. SEEK IMMEDIATE MEDICAL CARE IF:  You are bleeding, leaking fluid, or passing tissue from the vagina.  You have increasing pain or cramping.  You have persistent vomiting.  You have painful or bloody urination.  You have a fever.  You notice a decrease in your baby's movements.  You have extreme weakness or feel faint.  You have shortness of breath, with or without abdominal pain.  You develop a severe headache with abdominal pain.  You have abnormal vaginal discharge with abdominal pain.  You have persistent  diarrhea.  You have abdominal pain that continues even after rest, or gets worse. MAKE SURE YOU:   Understand these instructions.  Will watch your condition.  Will get help right away if you are not doing well or get worse. Document Released: 04/30/2005 Document Revised: 02/18/2013 Document Reviewed: 11/27/2012 [facility] Patient Information 2015 [place], [place]. This information is not intended to replace advice given to you by your health care provider. Make sure you discuss any questions you have with your health care provider.  Urinary Tract Infection A urinary tract infection (UTI) can occur any place along the urinary tract. The tract includes the kidneys, ureters, bladder, and urethra. A type of germ called bacteria often causes a UTI. UTIs are often helped with antibiotic medicine.  HOME CARE   If given, take antibiotics as told by your doctor. Finish them even if you start to feel better.  Drink enough fluids to keep your pee (urine) clear or pale yellow.  Avoid tea, drinks with caffeine, and bubbly (carbonated) drinks.  Pee often. Avoid holding your pee in for a long time.  Pee before and after having sex (intercourse).  Wipe from front to back after you poop (bowel movement) if you are a woman. Use each tissue only once. GET HELP RIGHT AWAY IF:   You have back pain.  You have lower belly (abdominal) pain.  You have chills.  You feel sick to your stomach (nauseous).  You throw up (vomit).  Your burning or discomfort with peeing does not go away.  You have a fever.  Your symptoms  are not better in 3 days. MAKE SURE YOU:   Understand these instructions.  Will watch your condition.  Will get help right away if you are not doing well or get worse. Document Released: 10/17/2007 Document Revised: 01/23/2012 Document Reviewed: 11/29/2011 [facility] Patient Information 2015 [place], [place]. This information is not intended to replace advice given to you by your  health care provider. Make sure you discuss any questions you have with your health care provider.

## 2014-12-19 NOTE — ED Notes (Signed)
PA at bedside.

## 2014-12-20 LAB — GC/CHLAMYDIA PROBE AMP (~~LOC~~) NOT AT ARMC
Chlamydia: NEGATIVE
NEISSERIA GONORRHEA: NEGATIVE

## 2014-12-21 LAB — URINE CULTURE: Culture: 80000

## 2014-12-22 ENCOUNTER — Telehealth (HOSPITAL_COMMUNITY): Payer: Self-pay

## 2014-12-22 NOTE — Telephone Encounter (Signed)
Post ED Visit - Positive Culture Follow-up  Culture report reviewed by antimicrobial stewardship pharmacist:  Wes Dulaney, Pharm.D., BCPS  Celedonio Miyamoto, Pharm.D., BCPS  Georgina Pillion, [address].D., BCPS  [place], [address].D., BCPS, AAHIVP  Estella Husk, Pharm.D., BCPS, AAHIVP  Elder Cyphers, [address].D., BCPS X  Stone,T. Pharm.  Positive Urine culture, >/= 80,000 colonies -> E Coli Treated with Cephalexin, organism sensitive to the same and no further patient follow-up is required at this time.  Arvid Right 12/22/2014, 10:57 AM

## 2015-03-08 ENCOUNTER — Emergency Department (HOSPITAL_COMMUNITY)

## 2015-03-08 ENCOUNTER — Emergency Department (HOSPITAL_COMMUNITY)
Admission: EM | Admit: 2015-03-08 | Discharge: 2015-03-08 | Disposition: A | Discharge status: Home / Self Care | Payer: Self-pay | Attending: Emergency Medicine | Admitting: Emergency Medicine

## 2015-03-08 ENCOUNTER — Encounter (HOSPITAL_COMMUNITY): Payer: Self-pay | Admitting: *Deleted

## 2015-03-08 DIAGNOSIS — Z8679 Personal history of other diseases of the circulatory system: Secondary | ICD-10-CM | POA: Insufficient documentation

## 2015-03-08 DIAGNOSIS — R109 Unspecified abdominal pain: Secondary | ICD-10-CM

## 2015-03-08 DIAGNOSIS — O9989 Other specified diseases and conditions complicating pregnancy, childbirth and the puerperium: Secondary | ICD-10-CM | POA: Insufficient documentation

## 2015-03-08 DIAGNOSIS — Z3A17 17 weeks gestation of pregnancy: Secondary | ICD-10-CM | POA: Insufficient documentation

## 2015-03-08 DIAGNOSIS — O26899 Other specified pregnancy related conditions, unspecified trimester: Secondary | ICD-10-CM

## 2015-03-08 DIAGNOSIS — R1030 Lower abdominal pain, unspecified: Secondary | ICD-10-CM | POA: Insufficient documentation

## 2015-03-08 DIAGNOSIS — O210 Mild hyperemesis gravidarum: Secondary | ICD-10-CM | POA: Insufficient documentation

## 2015-03-08 LAB — COMPREHENSIVE METABOLIC PANEL
ALBUMIN: 3.5 g/dL (ref 3.5–5.0)
ALT: 10 U/L — ABNORMAL LOW (ref 14–54)
AST: 15 U/L (ref 15–41)
Alkaline Phosphatase: 58 U/L (ref 38–126)
Anion gap: 8 (ref 5–15)
BILIRUBIN TOTAL: 0.3 mg/dL (ref 0.3–1.2)
BUN: 6 mg/dL (ref 6–20)
CHLORIDE: 106 mmol/L (ref 101–111)
CO2: 21 mmol/L — ABNORMAL LOW (ref 22–32)
Calcium: 8.9 mg/dL (ref 8.9–10.3)
Creatinine, Ser: 0.62 mg/dL (ref 0.44–1.00)
GFR calc Af Amer: >60 mL/min (ref >60)
GFR calc non Af Amer: >60 mL/min (ref >60)
GLUCOSE: 82 mg/dL (ref 65–99)
Potassium: 3.3 mmol/L — ABNORMAL LOW (ref 3.5–5.1)
Sodium: 135 mmol/L (ref 135–145)
Total Protein: 7.2 g/dL (ref 6.5–8.1)

## 2015-03-08 LAB — CBC WITH DIFFERENTIAL/PLATELET
BASOS ABS: 0 10*3/uL (ref 0.0–0.1)
BASOS PCT: 0 %
Eosinophils Absolute: 0 10*3/uL (ref 0.0–0.7)
Eosinophils Relative: 0 %
HEMATOCRIT: 32 % — AB (ref 36.0–46.0)
HEMOGLOBIN: 10.6 g/dL — AB (ref 12.0–15.0)
Lymphocytes Relative: 12 %
Lymphs Abs: 1.4 10*3/uL (ref 0.7–4.0)
MCH: 23.6 pg — ABNORMAL LOW (ref 26.0–34.0)
MCHC: 33.1 g/dL (ref 30.0–36.0)
MCV: 71.3 fL — ABNORMAL LOW (ref 78.0–100.0)
Monocytes Absolute: 0.8 10*3/uL (ref 0.1–1.0)
Monocytes Relative: 7 %
NEUTROS ABS: 9.4 10*3/uL — AB (ref 1.7–7.7)
NEUTROS PCT: 81 %
PLATELETS: 311 10*3/uL (ref 150–400)
RBC: 4.49 MIL/uL (ref 3.87–5.11)
RDW: 14.1 % (ref 11.5–15.5)
WBC: 11.6 10*3/uL — ABNORMAL HIGH (ref 4.0–10.5)

## 2015-03-08 LAB — URINALYSIS, ROUTINE W REFLEX MICROSCOPIC
Bilirubin Urine: NEGATIVE
Glucose, UA: NEGATIVE mg/dL
Hgb urine dipstick: NEGATIVE
Ketones, ur: >80 mg/dL — AB
NITRITE: NEGATIVE
PH: 6.5 (ref 5.0–8.0)
Protein, ur: NEGATIVE mg/dL
SPECIFIC GRAVITY, URINE: 1.021 (ref 1.005–1.030)
Urobilinogen, UA: 0.2 mg/dL (ref 0.0–1.0)

## 2015-03-08 LAB — URINE MICROSCOPIC-ADD ON

## 2015-03-08 LAB — WET PREP, GENITAL: TRICH WET PREP: NONE SEEN

## 2015-03-08 LAB — LIPASE, BLOOD: Lipase: 54 U/L — ABNORMAL HIGH (ref 11–51)

## 2015-03-08 LAB — HCG, QUANTITATIVE, PREGNANCY: hCG, Beta Chain, Quant, S: 8038 m[IU]/mL — ABNORMAL HIGH (ref <5)

## 2015-03-08 MED ORDER — PRENATAL COMPLETE 14-0.4 MG PO TABS: 1.0000 | ORAL_TABLET | Freq: Every day | ORAL | Status: AC

## 2015-03-08 MED ORDER — POTASSIUM CHLORIDE CRYS ER 20 MEQ PO TBCR
40.0000 meq | EXTENDED_RELEASE_TABLET | Freq: Once | ORAL | Status: AC
  Administered 2015-03-08: 40 meq via ORAL
  Filled 2015-03-08: qty 2

## 2015-03-08 MED ORDER — ACETAMINOPHEN 325 MG PO TABS
650.0000 mg | ORAL_TABLET | Freq: Once | ORAL | Status: AC
  Administered 2015-03-08: 650 mg via ORAL
  Filled 2015-03-08: qty 2

## 2015-03-08 NOTE — ED Notes (Signed)
Pt states that she is 2-3 months pregnant and began having lower abd pain yesterday; pt states that the pain has progressively gotten worse and now is a stabbing pain; pt denies vaginal bleeding or discharge; pt c/o N/V

## 2015-03-08 NOTE — ED Notes (Signed)
Pt informed of need for urine  Pt unable to void at this time

## 2015-03-08 NOTE — ED Provider Notes (Signed)
CSN: [id number]     Arrival date & time 03/08/15  0630 History   First MD Initiated Contact with Patient 03/08/15 [phone]     Chief Complaint  Patient presents with  . Abdominal Pain    HPI   Lindsey Bryan is a 24 y.o. female with a PMH of migraines who presents to the ED with lower abdominal pain, which she states started several hours ago. She reports her symptoms have progressively worsened since that time. She states her pain is intermittent and is "sharp and cramping." She denies exacerbating factors. She has not tried anything for symptom relief. She states she is 2-3 months pregnant. Denies fever, chills, chest pain, shortness of breath, diarrhea, constipation, dysuria, urgency, frequency, vaginal bleeding, vaginal discharge. She reports nausea and one episode of emesis. She denies hematemesis.    Past Medical History  Diagnosis Date  . Headache(784.0)   . Migraines    Past Surgical History  Procedure Laterality Date  . Foot surgery    . Foot surgery     Family History  Problem Relation Age of Onset  . Asthma Mother   . Diabetes Other   . Stroke Other    Social History  Substance Use Topics  . Smoking status: Never Smoker   . Smokeless tobacco: None  . Alcohol Use: No   OB History    Gravida Para Term Preterm AB TAB SAB Ectopic Multiple Living   1               Review of Systems  Constitutional: Negative for fever and chills.  Respiratory: Negative for shortness of breath.   Cardiovascular: Negative for chest pain.  Gastrointestinal: Positive for nausea, vomiting and abdominal pain. Negative for diarrhea and constipation.  Genitourinary: Negative for dysuria, urgency, frequency, vaginal bleeding and vaginal discharge.  All other systems reviewed and are negative.     Allergies  Asa and Diazepam  Home Medications   Prior to Admission medications   Medication Sig Start Date End Date Taking? Authorizing Provider  cephALEXin (KEFLEX) 500 MG capsule Take 2  capsules (1,000 mg total) by mouth 2 (two) times daily. Patient not taking: Reported on 03/08/2015 12/19/14   Fayrene Helper, PA-C  Prenatal Vit-Fe Fumarate-FA (PRENATAL COMPLETE) 14-0.4 MG TABS Take 1 tablet by mouth daily. 03/08/15   Mady Gemma, PA-C  promethazine (PHENERGAN) 25 MG tablet Take 1 tablet (25 mg total) by mouth every 6 (six) hours as needed for nausea or vomiting. Patient not taking: Reported on 03/08/2015 12/19/14   Fayrene Helper, PA-C    BP 126/66 mmHg  Pulse 82  Temp(Src) 98.3 F (36.8 C) (Oral)  Resp 18  Ht  (1.6 m)  Wt 200 lb (90.719 kg)  BMI 35.44 kg/m2  SpO2 100%  LMP 11/01/2014 Physical Exam  Constitutional: She is oriented to person, place, and time. She appears well-developed and well-nourished. No distress.  HENT:  Head: Normocephalic and atraumatic.  Right Ear: External ear normal.  Left Ear: External ear normal.  Nose: Nose normal.  Mouth/Throat: Uvula is midline, oropharynx is clear and moist and mucous membranes are normal.  Eyes: Conjunctivae, EOM and lids are normal. Pupils are equal, round, and reactive to light. Right eye exhibits no discharge. Left eye exhibits no discharge. No scleral icterus.  Neck: Normal range of motion. Neck supple.  Cardiovascular: Normal rate, regular rhythm, normal heart sounds, intact distal pulses and normal pulses.   Pulmonary/Chest: Effort normal and breath sounds normal. No respiratory distress.  She has no wheezes. She has no rales. She exhibits no tenderness.  Abdominal: Soft. Normal appearance and bowel sounds are normal. She exhibits no distension and no mass. There is tenderness. There is no rigidity, no rebound and no guarding.  TTP of suprapubic region. No rebound, guarding, or palpable masses.  Genitourinary: Uterus is enlarged and tender. Cervix exhibits no motion tenderness, no discharge and no friability. Right adnexum displays no mass, no tenderness and no fullness. Left adnexum displays no mass, no  tenderness and no fullness. No erythema, tenderness or bleeding in the vagina. No foreign body around the vagina. No signs of injury around the vagina. No vaginal discharge found.  Cervical os closed. Mild TTP of uterus.   Musculoskeletal: Normal range of motion. She exhibits no edema or tenderness.  Neurological: She is alert and oriented to person, place, and time.  Skin: Skin is warm, dry and intact. No rash noted. She is not diaphoretic. No erythema. No pallor.  Psychiatric: She has a normal mood and affect. Her speech is normal and behavior is normal.  Nursing note and vitals reviewed.   ED Course  Procedures (including critical care time)  Labs Review Labs Reviewed  WET PREP, GENITAL - Abnormal; Notable for the following:    Yeast Wet Prep HPF POC RARE (*)    Clue Cells Wet Prep HPF POC FEW (*)    WBC, Wet Prep HPF POC FEW (*)    All other components within normal limits  CBC WITH DIFFERENTIAL/PLATELET - Abnormal; Notable for the following:    WBC 11.6 (*)    Hemoglobin 10.6 (*)    HCT 32.0 (*)    MCV 71.3 (*)    MCH 23.6 (*)    Neutro Abs 9.4 (*)    All other components within normal limits  COMPREHENSIVE METABOLIC PANEL - Abnormal; Notable for the following:    Potassium 3.3 (*)    CO2 21 (*)    ALT 10 (*)    All other components within normal limits  LIPASE, BLOOD - Abnormal; Notable for the following:    Lipase 54 (*)    All other components within normal limits  URINALYSIS, ROUTINE W REFLEX MICROSCOPIC (NOT AT [facility]) - Abnormal; Notable for the following:    APPearance CLOUDY (*)    Ketones, ur >80 (*)    Leukocytes, UA TRACE (*)    All other components within normal limits  HCG, QUANTITATIVE, PREGNANCY - Abnormal; Notable for the following:    hCG, Beta Chain, Quant, S 8038 (*)    All other components within normal limits  URINE MICROSCOPIC-ADD ON - Abnormal; Notable for the following:    Squamous Epithelial / LPF FEW (*)    All other components within normal  limits  GC/CHLAMYDIA PROBE AMP ([place]) NOT AT [facility]    Imaging Review US Ob Limited  03/08/2015  CLINICAL DATA:  Acute lower abdominal and pelvic pain EXAM: LIMITED OBSTETRIC ULTRASOUND FINDINGS: Number of Fetuses: 1 Heart Rate:  145 bpm Movement: Yes Presentation: Breech Placental Location: Anterior.  No demonstrable placental abruption. Previa: No Amniotic Fluid (Subjective): Within normal limits for gestational age. BPD:  3.8cm 17w  5d MATERNAL FINDINGS: Cervix:  Appears closed. Uterus/Adnexae:  No abnormality visualized. There is a contraction seen along the posterior aspect of the uterus. IMPRESSION: Single live intrauterine gestation with estimated gestational age of 76- weeks. No placental abruption or previa. Cervical os closed. Amniotic fluid volume within normal limits for gestational age. This exam  is performed as a limited study on an emergent basis to address a specific clinical concern and does not comprehensively evaluate fetal size, dating, or anatomy; follow-up complete OB US should be considered if further fetal assessment is warranted. Electronically Signed   By: Bretta BangWilliam  Woodruff III M.D.   On: 03/08/2015 08:16     I have personally reviewed and evaluated these images and lab results as part of my medical decision-making.   EKG Interpretation None      MDM   Final diagnoses:  Abdominal pain in pregnancy    24 year old female presents with intermittent, sharp and cramping, lower abdominal pain, which started several hours ago and has progressively worsened since that time. She reports associated nausea and one episode of emesis. She denies fever, chills, chest pain, shortness of breath, diarrhea, constipation, dysuria, urgency, frequency, vaginal bleeding, vaginal discharge. She states she is 2-3 months pregnant. She had an ultrasound in the emergency department 08/06 confirming intrauterine pregnancy (5 weeks 6 days by crown-rump length), but has not received prenatal  care since that time.  Patient is afebrile. Vital signs stable. Heart regular rate and rhythm. Lungs clear to auscultation bilaterally. Abdomen soft, nondistended. Mild tenderness to palpation of suprapubic region. No rebound, guarding, or palpable masses. On pelvic exam, no significant discharge in vaginal vault. No CMT or friability. Cervical os closed. No adnexal tenderness. Uterus enlarged, with mild TTP.  Patient given tylenol for pain. Labs pending. Will obtain ultrasound given uterine tenderness on exam.  CBC with mild leukocytosis of 11.6, hemoglobin 10.6. Wet prep rare yeast, few clue cells, few white blood cells. Doubt infection given patient has no complaints of vaginal discharge and has no significant vaginal discharge on pelvic exam. Do not feel treatment is indicated at this time. CMP remarkable for potassium 3.3, repleted in the ED. Lipase mildly elevated at 54, no clinical evidence of pancreatitis. UA negative for infection. Ultrasound reveals single live intrauterine gestation with estimated gestational age of [redacted] weeks. No placental abruption or previa. Cervical os closed. Amniotic fluid volume within normal limits for gestational age. Heart rate 145 bpm. HCG 8038, appropriate for gestational age on ultrasound.   Discussed findings with patient. Patient is well-appearing, feel she is stable for discharge at this time. Will give prescription for prenatal vitamin. Stressed importance of follow-up and prenatal care. Resource list given. Return precautions discussed at length. Patient verbalizes her understanding and is in agreement with plan.  BP 126/66 mmHg  Pulse 82  Temp(Src) 98.3 F (36.8 C) (Oral)  Resp 18  Ht 5\' 3"  (1.6 m)  Wt 200 lb (90.719 kg)  BMI 35.44 kg/m2  SpO2 100%  LMP 11/01/2014     Mady GemmaElizabeth C Westfall, PA-C 03/08/15 1556  Dione Boozeavid Glick, MD 03/09/15 [phone]

## 2015-03-08 NOTE — ED Notes (Signed)
Patient transported to Ultrasound 

## 2015-03-08 NOTE — Discharge Instructions (Signed)
1. Medications: prenatal vitamin, usual home medications 2. Treatment: rest, drink plenty of fluids  3. Follow Up: please followup with [facility] for prenatal care; if you do not have a primary care doctor use the resource guide provided to find one; please return to the ER for severe abdominal pain, vaginal bleeding, persistent vomiting, new or worsening symptoms   Abdominal Pain, Adult Many things can cause abdominal pain. Usually, abdominal pain is not caused by a disease and will improve without treatment. It can often be observed and treated at home. Your health care provider will do a physical exam and possibly order blood tests and X-rays to help determine the seriousness of your pain. However, in many cases, more time must pass before a clear cause of the pain can be found. Before that point, your health care provider may not know if you need more testing or further treatment. HOME CARE INSTRUCTIONS Monitor your abdominal pain for any changes. The following actions may help to alleviate any discomfort you are experiencing:  Only take over-the-counter or prescription medicines as directed by your health care provider.  Do not take laxatives unless directed to do so by your health care provider.  Try a clear liquid diet (broth, tea, or water) as directed by your health care provider. Slowly move to a bland diet as tolerated. SEEK MEDICAL CARE IF:  You have unexplained abdominal pain.  You have abdominal pain associated with nausea or diarrhea.  You have pain when you urinate or have a bowel movement.  You experience abdominal pain that wakes you in the night.  You have abdominal pain that is worsened or improved by eating food.  You have abdominal pain that is worsened with eating fatty foods.  You have a fever. SEEK IMMEDIATE MEDICAL CARE IF:  Your pain does not go away within 2 hours.  You keep throwing up (vomiting).  Your pain is felt only in portions of the  abdomen, such as the right side or the left lower portion of the abdomen.  You pass bloody or black tarry stools. MAKE SURE YOU:  Understand these instructions.  Will watch your condition.  Will get help right away if you are not doing well or get worse.   This information is not intended to replace advice given to you by your health care provider. Make sure you discuss any questions you have with your health care provider.   Document Released: 02/07/2005 Document Revised: 01/19/2015 Document Reviewed: 01/07/2013 Elsevier Interactive Patient Education 2016 [employer].   Emergency Department Resource Guide 1) Find a Doctor and Pay Out of Pocket Although you won't have to find out who is covered by your insurance plan, it is a good idea to ask around and get recommendations. You will then need to call the office and see if the doctor you have chosen will accept you as a new patient and what types of options they offer for patients who are self-pay. Some doctors offer discounts or will set up payment plans for their patients who do not have insurance, but you will need to ask so you aren't surprised when you get to your appointment.  2) Contact Your Local Health Department Not all health departments have doctors that can see patients for sick visits, but many do, so it is worth a call to see if yours does. If you don't know where your local health department is, you can check in your phone book. The CDC also has a tool  to help you locate your state's health department, and many state websites also have listings of all of their local health departments.  3) Find a Walk-in Clinic If your illness is not likely to be very severe or complicated, you may want to try a walk in clinic. These are popping up all over the country in pharmacies, drugstores, and shopping centers. They're usually staffed by nurse practitioners or physician assistants that have been trained to treat common illnesses and  complaints. They're usually fairly quick and inexpensive. However, if you have serious medical issues or chronic medical problems, these are probably not your best option.  No Primary Care Doctor: - Call Health Connect at  [phone] - they can help you locate a primary care doctor that  accepts your insurance, provides certain services, etc. - Physician Referral Service- [phone]  Chronic Pain Problems: Organization         Address  Phone   Notes  [facility]  [phone] Patients need to be referred by their primary care doctor.   Medication Assistance: Organization         Address  Phone   Notes  [facility]ication [facility] [address] [place], [place] [address] [phone]) [phone] --Must be a resident of [place] [place] -- Must have NO insurance coverage whatsoever (no Medicaid/ Medicare, etc.) -- The pt. MUST have a primary care doctor that directs their care regularly and follows them in the community   MedAssist  [phone]) [phone]   [employer]  [phone]    Agencies that provide inexpensive medical care: Organization         Address  Phone   Notes  [facility]  [phone]   [facility]    [phone]   [facility] [address] [place], [place] [address] [phone]   [facility] [address], [place] [phone]) [phone]   Planned Parenthood    [phone]) [phone]   [facility]    [phone]   [facility]  [address], [place] Phone:  [phone](336) [phone], Fax:  [phone] Hours of Operation:  9 am - 6 pm, M-F.  Also accepts Medicaid/Medicare and self-pay.  [facility]  [address], [place] Phone: [phone](336) [phone], Fax: [phone]. Hours of Operation:  8:30 am - 5:30 pm, M-F.  Also accepts Medicaid and self-pay.  [facility] [place] [address], [place] Phone: [phone]   [facility] [address], [place], [place] [phone], Ext. [phone] Mondays & Thursdays: 7-9 AM.  First 15 patients are seen on a first come, first serve basis.    Medicaid-accepting [facility] Providers:  Organization         Address  Phone   Notes  [facility] [address], [place] [phone] Also accepts self-pay patients.  [facility] [address], [place]  [phone]   [facility] [address], [place] [phone]) [phone]   [facility] [address] [place] [address], [place] [phone]) [phone]   Renaye RakersVeita Bland [address], [place]   [phone] Only accepts [place] Access [place] patients after they have their name applied to their card.   Self-Pay (no insurance) in [place]:  Organization         Address  Phone   Notes  Sickle Cell Patients, [facility] [address] [place], [place] [phone]   [facility] [address] [place], [place] [phone]   [facility] [place]  [address], [place] [phone]   [facility]/Dr. Osei-Bonsu  [address] [place] or [address], [place] [phone] Phone number for both [place] and [place] locations is the same.  Urgent Medical and [facility] [address] [place] [phone]   [facility] [address] [place] or [address] [phone]   [facility] [address], [place] [phone], phone; [phone], fax Sees patients 1st and 3rd Saturday of every month.  Must not qualify for public or private insurance (i.e. Medicaid, Medicare, [place] Health Choice, Veterans' Benefits)  Household income should be no more than 200% of the poverty level  The clinic cannot treat you if you are pregnant or think you are pregnant  Sexually transmitted diseases are not treated at the clinic.    Dental Care: Organization         Address  Phone  Notes  [facility] [address] [place], [place] [phone] Accepts children up to age 8 who are enrolled in [place] or [place] Health Choice; pregnant women with a Medicaid card; and children who have applied for Medicaid or [place] Health Choice, but were declined, whose parents can pay a reduced fee at time of service.  [facility]  [address], [place] [phone] Accepts children up to age 107 who are enrolled in [place] or [place] Health Choice; pregnant women with a Medicaid card; and children who have applied for Medicaid or [place] Health Choice, but were declined, whose parents can pay a reduced fee at time of service.  [facility]  [address] [place], [place] [phone] Patients are seen by appointment only. Walk-ins are not accepted. [facility] will see patients 64 years of age and older. Monday - Tuesday (8am-5pm) Most Wednesdays (8:30-5pm) $30 per visit, cash only  [facility]  [address], [facility] [phone] Patients are seen by appointment only. Walk-ins are not accepted. [facility] will see patients 76 years of age and older. One Wednesday Evening (Monthly: Volunteer Based).  $30 per visit, cash only  [employer]  [phone] for adults; Children under age 31, call Graduate Pediatric Dentistry at [phone]. Children aged 71-14, please call [phone] to request a pediatric application.  Dental services are provided in all areas of dental care including fillings, crowns and bridges, complete and partial dentures, implants, gum treatment, root canals, and extractions. Preventive care is  also provided. Treatment is provided to both adults and children. Patients are selected via a lottery and there is often a waiting list.   [facility] [address] [place]  [phone] [email or web address]   [facility] [address] [place], [place] [phone], Ext. [phone] Second and Fourth Thursday of each month, opens at 6:30 AM; Clinic ends at 9 AM.  Patients are seen on a first-come first-served basis, and a limited number are seen during each clinic.  [facility]  [address] Hillard Danker [place], [place] [phone]   Eligibility Requirements You must have lived in [place], [place], or [place] counties for at least the last three months.   You cannot be eligible for state or federal sponsored [employer], including [employer], [place], or [employer].   You generally cannot be eligible for healthcare insurance through your employer.    How to apply: Eligibility screenings are held every Tuesday and Wednesday afternoon from 1:00 pm until 4:00 pm. You do not need an appointment for the interview!  [facility] [address], [place], [place]   [place]  [place] Department  [place]  [phone]    Behavioral Health Resources in the Community: Intensive Outpatient Programs Organization         Address  Phone  Notes  [place]. [address] [place], [place] [phone]   [facility] Outpatient [address] [place], [place]   ADS: Alcohol & Drug Svcs [address] [place], [place]   [place] [address],  [place], [place] or [phone]   Substance Abuse Resources Organization         Address  Phone  Notes  Alcohol and Drug Services  [phone]   [place]  [phone]   The [place]   Chinita Pester  [phone]   Residential & Outpatient Substance Abuse Program  [phone]   Psychological Services Organization         Address  Phone  Notes  [facility] [place]  [place]  [phone]   [address], [place] or [phone]    Mobile Crisis Teams Organization         Address  Phone  Notes  [facility], Mobile Crisis Care Unit  [phone]   [facility]  [address] [place], [place]   [place] Levels [address] [place] [phone]    Self-Help/Support Groups Organization         Address  Phone             Notes  [place]. of [place] - variety of support groups  [place] Call for more information  Narcotics Anonymous (NA), [facility] [address], [employer]   2 meetings at this location   Special educational needs teacher         Address  Phone  Notes  ASAP Residential Treatment [place],    [place]  [phone]   [facility]  [address], [place] [phone], [place], [place]   [place], [place] [phone] Admissions: 8am-3pm M-F  Incentives Substance [place] [address]    [place], [place] [phone]   [facility] [address] Jadene Pierini [place], [place]   [facility] [address],  [place], [place]   Insight Programs - Intensive Outpatient [address], [place], [place]   [facility] (Omega[facility] [place].,  [place], [place] or [phone]   [facility] Services (RTS) [address] [place], [place] Accepts Medicaid  [facility] [place] [address]  [place] [phone]  Substance Abuse/Addiction  [place] Resources Organization         Address  Phone  Notes  [facility]  [phone]   Domenic Schwab, PhD [address] Arlis Porta [place], [place]   [phone] or [phone]   [place], [place] [phone]   [address], [place], [place] [phone] Insurance/Medicaid/sponsorship through [facility] and Families [address], [address] [place]                                    [place], [place] [phone] [place] [address], [place] [phone]    Dr. Adele Schilder  [phone]   [facility] Dept. 1) [address], [place] 2) [place] 3)  [place] [address], [place] [phone]  [phone]   [facility] [phone] or [phone] (After Hours)

## 2015-03-09 LAB — GC/CHLAMYDIA PROBE AMP (~~LOC~~) NOT AT ARMC
Chlamydia: NEGATIVE
Neisseria Gonorrhea: NEGATIVE

## 2015-04-10 ENCOUNTER — Emergency Department (HOSPITAL_COMMUNITY)
Admission: EM | Admit: 2015-04-10 | Discharge: 2015-04-10 | Disposition: A | Discharge status: Home / Self Care | Payer: Medicaid Other | Attending: Emergency Medicine | Admitting: Emergency Medicine

## 2015-04-10 ENCOUNTER — Encounter (HOSPITAL_COMMUNITY): Payer: Self-pay | Admitting: Nurse Practitioner

## 2015-04-10 DIAGNOSIS — J069 Acute upper respiratory infection, unspecified: Secondary | ICD-10-CM

## 2015-04-10 DIAGNOSIS — Z3A16 16 weeks gestation of pregnancy: Secondary | ICD-10-CM | POA: Insufficient documentation

## 2015-04-10 DIAGNOSIS — O99512 Diseases of the respiratory system complicating pregnancy, second trimester: Secondary | ICD-10-CM | POA: Diagnosis not present

## 2015-04-10 DIAGNOSIS — Z79899 Other long term (current) drug therapy: Secondary | ICD-10-CM | POA: Insufficient documentation

## 2015-04-10 DIAGNOSIS — O210 Mild hyperemesis gravidarum: Secondary | ICD-10-CM | POA: Diagnosis not present

## 2015-04-10 DIAGNOSIS — Z8679 Personal history of other diseases of the circulatory system: Secondary | ICD-10-CM | POA: Diagnosis not present

## 2015-04-10 DIAGNOSIS — O9989 Other specified diseases and conditions complicating pregnancy, childbirth and the puerperium: Secondary | ICD-10-CM | POA: Diagnosis present

## 2015-04-10 DIAGNOSIS — Z3492 Encounter for supervision of normal pregnancy, unspecified, second trimester: Secondary | ICD-10-CM

## 2015-04-10 LAB — CBC
HEMATOCRIT: 33.3 % — AB (ref 36.0–46.0)
HEMOGLOBIN: 11 g/dL — AB (ref 12.0–15.0)
MCH: 24 pg — ABNORMAL LOW (ref 26.0–34.0)
MCHC: 33 g/dL (ref 30.0–36.0)
MCV: 72.5 fL — ABNORMAL LOW (ref 78.0–100.0)
Platelets: 350 10*3/uL (ref 150–400)
RBC: 4.59 MIL/uL (ref 3.87–5.11)
RDW: 14 % (ref 11.5–15.5)
WBC: 14 10*3/uL — ABNORMAL HIGH (ref 4.0–10.5)

## 2015-04-10 LAB — COMPREHENSIVE METABOLIC PANEL
ALT: 14 U/L (ref 14–54)
AST: 14 U/L — ABNORMAL LOW (ref 15–41)
Albumin: 3.4 g/dL — ABNORMAL LOW (ref 3.5–5.0)
Alkaline Phosphatase: 75 U/L (ref 38–126)
Anion gap: 7 (ref 5–15)
BUN: 6 mg/dL (ref 6–20)
CHLORIDE: 107 mmol/L (ref 101–111)
CO2: 23 mmol/L (ref 22–32)
Calcium: 8.9 mg/dL (ref 8.9–10.3)
Creatinine, Ser: 0.64 mg/dL (ref 0.44–1.00)
GFR calc non Af Amer: >60 mL/min (ref >60)
Glucose, Bld: 86 mg/dL (ref 65–99)
Potassium: 3.5 mmol/L (ref 3.5–5.1)
Sodium: 137 mmol/L (ref 135–145)
TOTAL PROTEIN: 7 g/dL (ref 6.5–8.1)
Total Bilirubin: 0.5 mg/dL (ref 0.3–1.2)

## 2015-04-10 LAB — LIPASE, BLOOD: LIPASE: 24 U/L (ref 11–51)

## 2015-04-10 MED ORDER — AMOXICILLIN 500 MG PO CAPS: 500.0000 mg | ORAL_CAPSULE | Freq: Three times a day (TID) | ORAL | Status: DC

## 2015-04-10 MED ORDER — GUAIFENESIN 100 MG/5ML PO LIQD: 100.0000 mg | ORAL | Status: DC | PRN

## 2015-04-10 NOTE — Discharge Instructions (Signed)
Cough, Adult Coughing is a reflex that clears your throat and your airways. Coughing helps to heal and protect your lungs. It is normal to cough occasionally, but a cough that happens with other symptoms or lasts a long time may be a sign of a condition that needs treatment. A cough may last only 2-3 weeks (acute), or it may last longer than 8 weeks (chronic). CAUSES Coughing is commonly caused by:  Breathing in substances that irritate your lungs.  A viral or bacterial respiratory infection.  Allergies.  Asthma.  Postnasal drip.  Smoking.  Acid backing up from the stomach into the esophagus (gastroesophageal reflux).  Certain medicines.  Chronic lung problems, including COPD (or rarely, lung cancer).  Other medical conditions such as heart failure. HOME CARE INSTRUCTIONS  Pay attention to any changes in your symptoms. Take these actions to help with your discomfort:  Take medicines only as told by your health care provider.  If you were prescribed an antibiotic medicine, take it as told by your health care provider. Do not stop taking the antibiotic even if you start to feel better.  Talk with your health care provider before you take a cough suppressant medicine.  Drink enough fluid to keep your urine clear or pale yellow.  If the air is dry, use a cold steam vaporizer or humidifier in your bedroom or your home to help loosen secretions.  Avoid anything that causes you to cough at work or at home.  If your cough is worse at night, try sleeping in a semi-upright position.  Avoid cigarette smoke. If you smoke, quit smoking. If you need help quitting, ask your health care provider.  Avoid caffeine.  Avoid alcohol.  Rest as needed. SEEK MEDICAL CARE IF:   You have new symptoms.  You cough up pus.  Your cough does not get better after 2-3 weeks, or your cough gets worse.  You cannot control your cough with suppressant medicines and you are losing sleep.  You  develop pain that is getting worse or pain that is not controlled with pain medicines.  You have a fever.  You have unexplained weight loss.  You have night sweats. SEEK IMMEDIATE MEDICAL CA Second Trimester of Pregnancy The second trimester is from week 13 through week 28, months 4 through 6. The second trimester is often a time when you feel your best. Your body has also adjusted to being pregnant, and you begin to feel better physically. Usually, morning sickness has lessened or quit completely, you may have more energy, and you may have an increase in appetite. The second trimester is also a time when the fetus is growing rapidly. At the end of the sixth month, the fetus is about 9 inches long and weighs about 1 pounds. You will likely begin to feel the baby move (quickening) between 18 and 20 weeks of the pregnancy. BODY CHANGES Your body goes through many changes during pregnancy. The changes vary from woman to woman.   Your weight will continue to increase. You will notice your lower abdomen bulging out.  You may begin to get stretch marks on your hips, abdomen, and breasts.  You may develop headaches that can be relieved by medicines approved by your health care provider.  You may urinate more often because the fetus is pressing on your bladder.  You may develop or continue to have heartburn as a result of your pregnancy.  You may develop constipation because certain hormones are causing the muscles that  push waste through your intestines to slow down.  You may develop hemorrhoids or swollen, bulging veins (varicose veins).  You may have back pain because of the weight gain and pregnancy hormones relaxing your joints between the bones in your pelvis and as a result of a shift in weight and the muscles that support your balance.  Your breasts will continue to grow and be tender.  Your gums may bleed and may be sensitive to brushing and flossing.  Dark spots or blotches  (chloasma, mask of pregnancy) may develop on your face. This will likely fade after the baby is born.  A dark line from your belly button to the pubic area (linea nigra) may appear. This will likely fade after the baby is born.  You may have changes in your hair. These can include thickening of your hair, rapid growth, and changes in texture. Some women also have hair loss during or after pregnancy, or hair that feels dry or thin. Your hair will most likely return to normal after your baby is born. WHAT TO EXPECT AT YOUR PRENATAL VISITS During a routine prenatal visit:  You will be weighed to make sure you and the fetus are growing normally.  Your blood pressure will be taken.  Your abdomen will be measured to track your baby's growth.  The fetal heartbeat will be listened to.  Any test results from the previous visit will be discussed. Your health care provider may ask you:  How you are feeling.  If you are feeling the baby move.  If you have had any abnormal symptoms, such as leaking fluid, bleeding, severe headaches, or abdominal cramping.  If you are using any tobacco products, including cigarettes, chewing tobacco, and electronic cigarettes.  If you have any questions. Other tests that may be performed during your second trimester include:  Blood tests that check for:  Low iron levels (anemia).  Gestational diabetes (between 24 and 28 weeks).  Rh antibodies.  Urine tests to check for infections, diabetes, or protein in the urine.  An ultrasound to confirm the proper growth and development of the baby.  An amniocentesis to check for possible genetic problems.  Fetal screens for spina bifida and Down syndrome.  HIV (human immunodeficiency virus) testing. Routine prenatal testing includes screening for HIV, unless you choose not to have this test. HOME CARE INSTRUCTIONS   Avoid all smoking, herbs, alcohol, and unprescribed drugs. These chemicals affect the formation  and growth of the baby.  Do not use any tobacco products, including cigarettes, chewing tobacco, and electronic cigarettes. If you need help quitting, ask your health care provider. You may receive counseling support and other resources to help you quit.  Follow your health care provider's instructions regarding medicine use. There are medicines that are either safe or unsafe to take during pregnancy.  Exercise only as directed by your health care provider. Experiencing uterine cramps is a good sign to stop exercising.  Continue to eat regular, healthy meals.  Wear a good support bra for breast tenderness.  Do not use hot tubs, steam rooms, or saunas.  Wear your seat belt at all times when driving.  Avoid raw meat, uncooked cheese, cat litter boxes, and soil used by cats. These carry germs that can cause birth defects in the baby.  Take your prenatal vitamins.  Take 1500-2000 mg of calcium daily starting at the 20th week of pregnancy until you deliver your baby.  Try taking a stool softener (if your health care  provider approves) if you develop constipation. Eat more high-fiber foods, such as fresh vegetables or fruit and whole grains. Drink plenty of fluids to keep your urine clear or pale yellow.  Take warm sitz baths to soothe any pain or discomfort caused by hemorrhoids. Use hemorrhoid cream if your health care provider approves.  If you develop varicose veins, wear support hose. Elevate your feet for 15 minutes, 3-4 times a day. Limit salt in your diet.  Avoid heavy lifting, wear low heel shoes, and practice good posture.  Rest with your legs elevated if you have leg cramps or low back pain.  Visit your dentist if you have not gone yet during your pregnancy. Use a soft toothbrush to brush your teeth and be gentle when you floss.  A sexual relationship may be continued unless your health care provider directs you otherwise.  Continue to go to all your prenatal visits as  directed by your health care provider. SEEK MEDICAL CARE IF:   You have dizziness.  You have mild pelvic cramps, pelvic pressure, or nagging pain in the abdominal area.  You have persistent nausea, vomiting, or diarrhea.  You have a bad smelling vaginal discharge.  You have pain with urination. SEEK IMMEDIATE MEDICAL CARE IF:   You have a fever.  You are leaking fluid from your vagina.  You have spotting or bleeding from your vagina.  You have severe abdominal cramping or pain.  You have rapid weight gain or loss.  You have shortness of breath with chest pain.  You notice sudden or extreme swelling of your face, hands, ankles, feet, or legs.  You have not felt your baby move in over an hour.  You have severe headaches that do not go away with medicine.  You have vision changes.   This information is not intended to replace advice given to you by your health care provider. Make sure you discuss any questions you have with your health care provider.   Document Released: 04/24/2001 Document Revised: 05/21/2014 Document Reviewed: 07/01/2012 Elsevier Interactive Patient Education 2016 [employer]. RE IF:  You cough up blood.  You have difficulty breathing.  Your heartbeat is very fast.   This information is not intended to replace advice given to you by your health care provider. Make sure you discuss any questions you have with your health care provider.   Document Released: 10/27/2010 Document Revised: 01/19/2015 Document Reviewed: 07/07/2014 Elsevier Interactive Patient Education 2016 [employer].  Prenatal Care WHAT IS PRENATAL CARE?  Prenatal care is the process of caring for a pregnant woman before she gives birth. Prenatal care makes sure that she and her baby remain as healthy as possible throughout pregnancy. Prenatal care may be provided by a midwife, family practice health care provider, or a childbirth and pregnancy specialist (obstetrician). Prenatal  care may include physical examinations, testing, treatments, and education on nutrition, lifestyle, and social support services. WHY IS PRENATAL CARE SO IMPORTANT?  Early and consistent prenatal care increases the chance that you and your baby will remain healthy throughout your pregnancy. This type of care also decreases a baby's risk of being born too early (prematurely), or being born smaller than expected (small for gestational age). Any underlying medical conditions you may have that could pose a risk during your pregnancy are discussed during prenatal care visits. You will also be monitored regularly for any new conditions that may arise during your pregnancy so they can be treated quickly and effectively. WHAT HAPPENS DURING PRENATAL  CARE VISITS? Prenatal care visits may include the following: Discussion Tell your health care provider about any new signs or symptoms you have experienced since your last visit. These might include:  Nausea or vomiting.  Increased or decreased level of energy.  Difficulty sleeping.  Back or leg pain.  Weight changes.  Frequent urination.  Shortness of breath with physical activity.  Changes in your skin, such as the development of a rash or itchiness.  Vaginal discharge or bleeding.  Feelings of excitement or nervousness.  Changes in your baby's movements. You may want to write down any questions or topics you want to discuss with your health care provider and bring them with you to your appointment. Examination During your first prenatal care visit, you will likely have a complete physical exam. Your health care provider will often examine your vagina, cervix, and the position of your uterus, as well as check your heart, lungs, and other body systems. As your pregnancy progresses, your health care provider will measure the size of your uterus and your baby's position inside your uterus. He or she may also examine you for early signs of labor. Your  prenatal visits may also include checking your blood pressure and, after about 10-12 weeks of pregnancy, listening to your baby's heartbeat. Testing Regular testing often includes:  Urinalysis. This checks your urine for glucose, protein, or signs of infection.  Blood count. This checks the levels of white and red blood cells in your body.  Tests for sexually transmitted infections (STIs). Testing for STIs at the beginning of pregnancy is routinely done and is required in many states.  Antibody testing. You will be checked to see if you are immune to certain illnesses, such as rubella, that can affect a developing fetus.  Glucose screen. Around 24-28 weeks of pregnancy, your blood glucose level will be checked for signs of gestational diabetes. Follow-up tests may be recommended.  Group B strep. This is a bacteria that is commonly found inside a woman's vagina. This test will inform your health care provider if you need an antibiotic to reduce the amount of this bacteria in your body prior to labor and childbirth.  Ultrasound. Many pregnant women undergo an ultrasound screening around 18-20 weeks of pregnancy to evaluate the health of the fetus and check for any developmental abnormalities.  HIV (human immunodeficiency virus) testing. Early in your pregnancy, you will be screened for HIV. If you are at high risk for HIV, this test may be repeated during your third trimester of pregnancy. You may be offered other testing based on your age, personal or family medical history, or other factors.  HOW OFTEN SHOULD I PLAN TO SEE MY HEALTH CARE PROVIDER FOR PRENATAL CARE? Your prenatal care check-up schedule depends on any medical conditions you have before, or develop during, your pregnancy. If you do not have any underlying medical conditions, you will likely be seen for checkups:  Monthly, during the first 6 months of pregnancy.  Twice a month during months 7 and 8 of pregnancy.  Weekly  starting in the 9th month of pregnancy and until delivery. If you develop signs of early labor or other concerning signs or symptoms, you may need to see your health care provider more often. Ask your health care provider what prenatal care schedule is best for you. WHAT CAN I DO TO KEEP MYSELF AND MY BABY AS HEALTHY AS POSSIBLE DURING MY PREGNANCY?  Take a prenatal vitamin containing 400 micrograms (0.4 mg) of folic  acid every day. Your health care provider may also ask you to take additional vitamins such as iodine, vitamin D, iron, copper, and zinc.  Take 1500-2000 mg of calcium daily starting at your 20th week of pregnancy until you deliver your baby.  Make sure you are up to date on your vaccinations. Unless directed otherwise by your health care provider:  You should receive a tetanus, diphtheria, and pertussis (Tdap) vaccination between the 27th and 36th week of your pregnancy, regardless of when your last Tdap immunization occurred. This helps protect your baby from whooping cough (pertussis) after he or she is born.  You should receive an annual inactivated influenza vaccine (IIV) to help protect you and your baby from influenza. This can be done at any point during your pregnancy.  Eat a well-rounded diet that includes:  Fresh fruits and vegetables.  Lean proteins.  Calcium-rich foods such as milk, yogurt, hard cheeses, and dark, leafy greens.  Whole grain breads.  Do noteat seafood high in mercury, including:  Swordfish.  Tilefish.  Shark.  King mackerel.  More than 6 oz tuna per week.  Do not eat:  Raw or undercooked meats or eggs.  Unpasteurized foods, such as soft cheeses (brie, blue, or feta), juices, and milks.  Lunch meats.  Hot dogs that have not been heated until they are steaming.  Drink enough water to keep your urine clear or pale yellow. For many women, this may be 10 or more 8 oz glasses of water each day. Keeping yourself hydrated helps deliver  nutrients to your baby and may prevent the start of pre-term uterine contractions.  Do not use any tobacco products including cigarettes, chewing tobacco, or electronic cigarettes. If you need help quitting, ask your health care provider.  Do not drink beverages containing alcohol. No safe level of alcohol consumption during pregnancy has been determined.  Do not use any illegal drugs. These can harm your developing baby or cause a miscarriage.  Ask your health care provider or pharmacist before taking any prescription or over-the-counter medicines, herbs, or supplements.  Limit your caffeine intake to no more than 200 mg per day.  Exercise. Unless told otherwise by your health care provider, try to get 30 minutes of moderate exercise most days of the week. Do not  do high-impact activities, contact sports, or activities with a high risk of falling, such as horseback riding or downhill skiing.  Get plenty of rest.  Avoid anything that raises your body temperature, such as hot tubs and saunas.  If you own a cat, do not empty its litter box. Bacteria contained in cat feces can cause an infection called toxoplasmosis. This can result in serious harm to the fetus.  Stay away from chemicals such as insecticides, lead, mercury, and cleaning or paint products that contain solvents.  Do not have any X-rays taken unless medically necessary.  Take a childbirth and breastfeeding preparation class. Ask your health care provider if you need a referral or recommendation.   This information is not intended to replace advice given to you by your health care provider. Make sure you discuss any questions you have with your health care provider.   Document Released: 05/03/2003 Document Revised: 05/21/2014 Document Reviewed: 07/15/2013 Elsevier Interactive Patient Education [employer]

## 2015-04-10 NOTE — ED Notes (Signed)
Pt is 4 months pregnant, has not seen an OB-Gyn.  [place] & informed UA still pending.  Per Tiffany, is not needed.

## 2015-04-10 NOTE — ED Notes (Signed)
Pt reports an episode of diaphoresis with emesis this morning, adds that she has a nagging cough for the past one week, pt is also 4 months pregnant. Denies any other complaints.

## 2015-04-10 NOTE — ED Provider Notes (Signed)
CSN: [id number]     Arrival date & time 04/10/15  1521 History  By signing my name below, I, Murriel Hopper, attest that this documentation has been prepared under the direction and in the presence of Marlon Pel, PA-C.  Electronically Signed: Murriel Hopper, ED Scribe. 04/10/2015. 6:05 PM.    Chief Complaint  Patient presents with  . Emesis  . Cough      Patient is a 24 y.o. female presenting with vomiting and cough. The history is provided by the patient. No language interpreter was used.  Emesis Associated symptoms: headaches   Cough Associated symptoms: headaches    HPI Comments: Lindsey Bryan is a 24 y.o. female who is 4 months pregnant who presents to the Emergency Department complaining of constant, worsening productive cough with associated vomiting, abdominal pain, congestion and headache that has been present for about a week. Pt states her symptoms began with a dry cough.  Her cough became productive as she began having congestion shortly afterwards. Pt denies any other complaints. Pt hasnot yet established an OBGYN doctor but has started taking pre-natal vitamins. Pt has not been having vaginal bleeding, fluid leakage, or back pain. Just got approval for Medicaid and plans to see specialist this week.  ROS: The patient denies diaphoresis, fever, weakness (general or focal), confusion, change of vision,  dysphagia, aphagia, shortness of breath,  abdominal pains, diarrhea, lower extremity swelling, rash, neck pain, chest pain   Past Medical History  Diagnosis Date  . Headache(784.0)   . Migraines    Past Surgical History  Procedure Laterality Date  . Foot surgery    . Foot surgery     Family History  Problem Relation Age of Onset  . Asthma Mother   . Diabetes Other   . Stroke Other    Social History  Substance Use Topics  . Smoking status: Never Smoker   . Smokeless tobacco: None  . Alcohol Use: No   OB History    Gravida Para Term Preterm AB TAB SAB  Ectopic Multiple Living   1              Review of Systems  HENT: Positive for congestion.   Respiratory: Positive for cough.   Gastrointestinal: Positive for vomiting.  Genitourinary: Negative for vaginal bleeding, vaginal discharge, vaginal pain and pelvic pain.  Musculoskeletal: Negative for back pain.  Neurological: Positive for headaches.  All other systems reviewed and are negative.     Allergies  Asa and Diazepam  Home Medications   Prior to Admission medications   Medication Sig Start Date End Date Taking? Authorizing Provider  Chlorphen-Pseudoephed-APAP (THERAFLU FLU/COLD PO) Take 1 Package by mouth daily as needed (cold).   Yes Historical Provider, MD  Prenatal Vit-Fe Fumarate-FA (PRENATAL COMPLETE) 14-0.4 MG TABS Take 1 tablet by mouth daily. 03/08/15  Yes Mady Gemma, PA-C  amoxicillin (AMOXIL) 500 MG capsule Take 1 capsule (500 mg total) by mouth 3 (three) times daily. 04/10/15   Hayleen Clinkscales Neva Seat, PA-C  cephALEXin (KEFLEX) 500 MG capsule Take 2 capsules (1,000 mg total) by mouth 2 (two) times daily. Patient not taking: Reported on 03/08/2015 12/19/14   Fayrene Helper, PA-C  guaiFENesin (ROBITUSSIN) 100 MG/5ML liquid Take 5 mLs (100 mg total) by mouth every 4 (four) hours as needed for cough. 04/10/15   Othelia Riederer Neva Seat, PA-C  promethazine (PHENERGAN) 25 MG tablet Take 1 tablet (25 mg total) by mouth every 6 (six) hours as needed for nausea or vomiting. Patient not taking: Reported  on 03/08/2015 12/19/14   Fayrene HelperBowie Tran, PA-C   BP 139/78 mmHg  Pulse 106  Temp(Src) 98 F (36.7 C) (Oral)  Resp 18  SpO2 98%  LMP 11/01/2014 Physical Exam  Constitutional: She is oriented to person, place, and time. She appears well-developed and well-nourished.  HENT:  Head: Normocephalic and atraumatic.  Right Ear: Tympanic membrane and ear canal normal.  Left Ear: Tympanic membrane normal.  Nose: Rhinorrhea present.  Mouth/Throat: Uvula is midline and oropharynx is clear and moist.   Cardiovascular: Normal rate.   Pulmonary/Chest: Effort normal. No accessory muscle usage. No respiratory distress. She has rhonchi (to right upper lung field).  Abdominal: She exhibits no distension.  No tenderness to abdominal wall  Genitourinary:  Fetal heart tones are 132  Neurological: She is alert and oriented to person, place, and time. She has normal strength. No cranial nerve deficit or sensory deficit.  Skin: Skin is warm and dry.  Psychiatric: She has a normal mood and affect.  Nursing note and vitals reviewed.   ED Course  Procedures (including critical care time)  DIAGNOSTIC STUDIES: Oxygen Saturation is 98% on room air, normal by my interpretation.    COORDINATION OF CARE: 6:05 PM Discussed treatment plan with pt at bedside and pt agreed to plan.   Labs Review Labs Reviewed  COMPREHENSIVE METABOLIC PANEL - Abnormal; Notable for the following:    Albumin 3.4 (*)    AST 14 (*)    All other components within normal limits  CBC - Abnormal; Notable for the following:    WBC 14.0 (*)    Hemoglobin 11.0 (*)    HCT 33.3 (*)    MCV 72.5 (*)    MCH 24.0 (*)    All other components within normal limits  LIPASE, BLOOD    Imaging Review No results found. I have personally reviewed and evaluated these images and lab results as part of my medical decision-making.   EKG Interpretation None      MDM   Final diagnoses:  URI (upper respiratory infection)  Normal baseline fetal heart rate during pregnancy in second trimester    Pt has normal fetal heart tones, [facility], she is already on prenatal vitamins. She has no complaints about baby during her visit today. I hear abnormal lung sounds to the RUL field, will start on Amoxicillin and guaifen. Very strict return to ED precautions, pt understands medications and testing limited due to being gravid.  Medications - No data to display   I feel the patient has had an  appropriate workup for their chief complaint at this time and likelihood of emergent condition existing is low. Discussed s/sx that warrant return to the ED.  Filed Vitals:   04/10/15 1530  BP: 139/78  Pulse: 106  Temp: 98 F (36.7 C)  Resp: 18    I personally performed the services described in this documentation, which was scribed in my presence. The recorded information has been reviewed and is accurate.     Marlon Peliffany Lenny Bouchillon, PA-C 04/10/15 1830  Lorre NickAnthony Allen, MD 04/10/15 [phone]

## 2015-04-18 ENCOUNTER — Other Ambulatory Visit (HOSPITAL_COMMUNITY)
Admission: RE | Admit: 2015-04-18 | Discharge: 2015-04-18 | Disposition: A | Discharge status: Home / Self Care | Payer: Medicaid Other | Source: Ambulatory Visit | Attending: Medical | Admitting: Medical

## 2015-04-18 ENCOUNTER — Ambulatory Visit (INDEPENDENT_AMBULATORY_CARE_PROVIDER_SITE_OTHER): Payer: Medicaid Other | Admitting: Medical

## 2015-04-18 ENCOUNTER — Encounter: Payer: Self-pay | Admitting: Medical

## 2015-04-18 VITALS — BP 122/83 | HR 101 | Temp 98.2°F | Wt 189.0 lb

## 2015-04-18 DIAGNOSIS — O0932 Supervision of pregnancy with insufficient antenatal care, second trimester: Secondary | ICD-10-CM | POA: Diagnosis present

## 2015-04-18 DIAGNOSIS — Z113 Encounter for screening for infections with a predominantly sexual mode of transmission: Secondary | ICD-10-CM | POA: Insufficient documentation

## 2015-04-18 DIAGNOSIS — Z23 Encounter for immunization: Secondary | ICD-10-CM

## 2015-04-18 DIAGNOSIS — Z01411 Encounter for gynecological examination (general) (routine) with abnormal findings: Secondary | ICD-10-CM | POA: Insufficient documentation

## 2015-04-18 DIAGNOSIS — Z124 Encounter for screening for malignant neoplasm of cervix: Secondary | ICD-10-CM | POA: Diagnosis not present

## 2015-04-18 DIAGNOSIS — Z3402 Encounter for supervision of normal first pregnancy, second trimester: Secondary | ICD-10-CM

## 2015-04-18 DIAGNOSIS — Z3400 Encounter for supervision of normal first pregnancy, unspecified trimester: Secondary | ICD-10-CM | POA: Insufficient documentation

## 2015-04-18 LAB — POCT URINALYSIS DIP (DEVICE)
GLUCOSE, UA: NEGATIVE mg/dL
KETONES UR: 80 mg/dL — AB
NITRITE: NEGATIVE
Protein, ur: 100 mg/dL — AB
Specific Gravity, Urine: 1.025 (ref 1.005–1.030)
Urobilinogen, UA: 0.2 mg/dL (ref 0.0–1.0)
pH: 6.5 (ref 5.0–8.0)

## 2015-04-18 NOTE — Patient Instructions (Signed)
Second Trimester of Pregnancy  The second trimester is from week 13 through week 28, month 4 through 6. This is often the time in pregnancy that you feel your best. Often times, morning sickness has lessened or quit. You may have more energy, and you may get hungry more often. Your unborn baby (fetus) is growing rapidly. At the end of the sixth month, he or she is about 9 inches long and weighs about 1½ pounds. You will likely feel the baby move (quickening) between 18 and 20 weeks of pregnancy.  HOME CARE   · Avoid all smoking, herbs, and alcohol. Avoid drugs not approved by your doctor.  · Do not use any tobacco products, including cigarettes, chewing tobacco, and electronic cigarettes. If you need help quitting, ask your doctor. You may get counseling or other support to help you quit.  · Only take medicine as told by your doctor. Some medicines are safe and some are not during pregnancy.  · Exercise only as told by your doctor. Stop exercising if you start having cramps.  · Eat regular, healthy meals.  · Wear a good support bra if your breasts are tender.  · Do not use hot tubs, steam rooms, or saunas.  · Wear your seat belt when driving.  · Avoid raw meat, uncooked cheese, and liter boxes and soil used by cats.  · Take your prenatal vitamins.  · Take 1500-2000 milligrams of calcium daily starting at the 20th week of pregnancy until you deliver your baby.  · Try taking medicine that helps you poop (stool softener) as needed, and if your doctor approves. Eat more fiber by eating fresh fruit, vegetables, and whole grains. Drink enough fluids to keep your pee (urine) clear or pale yellow.  · Take warm water baths (sitz baths) to soothe pain or discomfort caused by hemorrhoids. Use hemorrhoid cream if your doctor approves.  · If you have puffy, bulging veins (varicose veins), wear support hose. Raise (elevate) your feet for 15 minutes, 3-4 times a day. Limit salt in your diet.  · Avoid heavy lifting, wear low heals,  and sit up straight.  · Rest with your legs raised if you have leg cramps or low back pain.  · Visit your dentist if you have not gone during your pregnancy. Use a soft toothbrush to brush your teeth. Be gentle when you floss.  · You can have sex (intercourse) unless your doctor tells you not to.  · Go to your doctor visits.  GET HELP IF:   · You feel dizzy.  · You have mild cramps or pressure in your lower belly (abdomen).  · You have a nagging pain in your belly area.  · You continue to feel sick to your stomach (nauseous), throw up (vomit), or have watery poop (diarrhea).  · You have bad smelling fluid coming from your vagina.  · You have pain with peeing (urination).  GET HELP RIGHT AWAY IF:   · You have a fever.  · You are leaking fluid from your vagina.  · You have spotting or bleeding from your vagina.  · You have severe belly cramping or pain.  · You lose or gain weight rapidly.  · You have trouble catching your breath and have chest pain.  · You notice sudden or extreme puffiness (swelling) of your face, hands, ankles, feet, or legs.  · You have not felt the baby move in over an hour.  · You have severe headaches that do   not go away with medicine.  · You have vision changes.     This information is not intended to replace advice given to you by your health care provider. Make sure you discuss any questions you have with your health care provider.     Document Released: 07/25/2009 Document Revised: 05/21/2014 Document Reviewed: 07/01/2012  Elsevier Interactive Patient Education ©2016 Elsevier Inc.

## 2015-04-18 NOTE — Progress Notes (Signed)
Subjective:  Lindsey Bryan is a 24 y.o. G1P0000 at 3074w0d being seen today for ongoing prenatal care.  She is currently monitored for the following issues for this low-risk pregnancy and has Supervision of low-risk first pregnancy on her problem list.  Patient reports occasional lower abdominal pain.  Contractions: Not present. Vag. Bleeding: None.  Movement: Present. Denies leaking of fluid.   The following portions of the patient's history were reviewed and updated as appropriate: allergies, current medications, past family history, past medical history, past social history, past surgical history and problem list. Problem list updated.  Objective:   Filed Vitals:   04/18/15 1333  BP: 122/83  Pulse: 101  Temp: 98.2 F (36.8 C)  Weight: 189 lb (85.73 kg)    Fetal Status: Fetal Heart Rate (bpm): 157   Movement: Present     General:  Alert, oriented and cooperative. Patient is in no acute distress.  Skin: Skin is warm and dry. No rash noted.   Cardiovascular: Normal heart rate noted  Respiratory: Normal respiratory effort, no problems with respiration noted  Abdomen: Soft, gravid, appropriate for gestational age. Pain/Pressure: Present     Pelvic: Vag. Bleeding: None Vag D/C Character: White  Cervix with normal contour. Small amount of thin, white discharge noted. Pap smear obtained. Fundal height approximately at the umbilicus  Cervical exam performed Dilation: Closed Effacement (%): Thick   closed, thick, posterior  Extremities: Normal range of motion.  Edema: None  Mental Status: Normal mood and affect. Normal behavior. Normal judgment and thought content.   Urinalysis: Urine Protein: 2+ Urine Glucose: Negative  Assessment and Plan:  Pregnancy: G1P0000 at 6874w0d  1. Late prenatal care affecting pregnancy in second trimester - Prenatal Profile - Culture, OB Urine - Prescript Monitor Profile(19) - GC/Chlamydia probe amp ([place])not at [facility] - Glucose Tolerance, 1 HR (50g)  w/o Fasting - Cytology - PAP - Hemoglobinopathy evaluation - US MFM OB COMP + 14 WK; Future - Flu Vaccine QUAD 36+ mos IM; Standing - AFP, Quad Screen - Flu Vaccine QUAD 36+ mos IM  2. Encounter for supervision of normal first pregnancy in second trimester See above  Preterm labor symptoms and general obstetric precautions including but not limited to vaginal bleeding, contractions, leaking of fluid and fetal movement were reviewed in detail with the patient. Please refer to After Visit Summary for other counseling recommendations.  Return in about 4 weeks (around 05/16/2015) for LOB.   Marny LowensteinJulie N Wenzel, PA-C

## 2015-04-19 LAB — CULTURE, OB URINE
COLONY COUNT: NO GROWTH
Organism ID, Bacteria: NO GROWTH

## 2015-04-19 LAB — AFP, QUAD SCREEN
AFP: 131.1 ng/mL
Age Alone: 1:1150 {titer}
CURR GEST AGE: 23 wks.days
Down Syndrome Scr Risk Est: <1:38500 {titer}
HCG TOTAL: 6.17 [IU]/mL
INH: 182.8 pg/mL
INTERPRETATION-AFP: NEGATIVE
MOM FOR AFP: 1.6
MOM FOR HCG: 0.42
MOM FOR INH: 0.87
OPEN SPINA BIFIDA: NEGATIVE
Osb Risk: 1:4190 {titer}
Tri 18 Scr Risk Est: NEGATIVE
Trisomy 18 (Edward) Syndrome Interp.: 1:21500 {titer}
uE3 Mom: 1.06
uE3 Value: 2.81 ng/mL

## 2015-04-19 LAB — CYTOLOGY - PAP

## 2015-04-19 LAB — GC/CHLAMYDIA PROBE AMP (~~LOC~~) NOT AT ARMC
Chlamydia: NEGATIVE
NEISSERIA GONORRHEA: NEGATIVE

## 2015-04-19 LAB — GLUCOSE TOLERANCE, 1 HOUR (50G) W/O FASTING: GLUCOSE 1 HOUR GTT: 135 mg/dL (ref 70–140)

## 2015-04-20 LAB — PRENATAL PROFILE (SOLSTAS)
Antibody Screen: NEGATIVE
Basophils Absolute: 0 10*3/uL (ref 0.0–0.1)
Basophils Relative: 0 % (ref 0–1)
EOS PCT: 1 % (ref 0–5)
Eosinophils Absolute: 0.1 10*3/uL (ref 0.0–0.7)
HCT: 32 % — ABNORMAL LOW (ref 36.0–46.0)
HIV: NONREACTIVE
Hemoglobin: 10.6 g/dL — ABNORMAL LOW (ref 12.0–15.0)
Hepatitis B Surface Ag: NEGATIVE
Lymphocytes Relative: 18 % (ref 12–46)
Lymphs Abs: 1.8 10*3/uL (ref 0.7–4.0)
MCH: 23.7 pg — ABNORMAL LOW (ref 26.0–34.0)
MCHC: 33.1 g/dL (ref 30.0–36.0)
MCV: 71.4 fL — ABNORMAL LOW (ref 78.0–100.0)
MONO ABS: 0.5 10*3/uL (ref 0.1–1.0)
MPV: 9.1 fL (ref 8.6–12.4)
Monocytes Relative: 5 % (ref 3–12)
Neutro Abs: 7.6 10*3/uL (ref 1.7–7.7)
Neutrophils Relative %: 76 % (ref 43–77)
PLATELETS: 317 10*3/uL (ref 150–400)
RBC: 4.48 MIL/uL (ref 3.87–5.11)
RDW: 15.4 % (ref 11.5–15.5)
RH TYPE: POSITIVE
Rubella: 2.1 Index — ABNORMAL HIGH (ref <0.90)
WBC: 10 10*3/uL (ref 4.0–10.5)

## 2015-04-21 LAB — HEMOGLOBINOPATHY EVALUATION
HGB A: 97.1 % (ref 96.8–97.8)
Hemoglobin Other: 0 %
Hgb A2 Quant: 2.4 % (ref 2.2–3.2)
Hgb F Quant: 0.5 % (ref 0.0–2.0)
Hgb S Quant: 0 %

## 2015-04-22 ENCOUNTER — Encounter: Payer: Self-pay | Admitting: Medical

## 2015-04-22 DIAGNOSIS — F1290 Cannabis use, unspecified, uncomplicated: Secondary | ICD-10-CM | POA: Insufficient documentation

## 2015-04-22 LAB — PRESCRIPTION MONITORING PROFILE (19 PANEL)
AMPHETAMINE/METH: NEGATIVE ng/mL
BUPRENORPHINE, URINE: NEGATIVE ng/mL
Barbiturate Screen, Urine: NEGATIVE ng/mL
Benzodiazepine Screen, Urine: NEGATIVE ng/mL
CARISOPRODOL, URINE: NEGATIVE ng/mL
Cocaine Metabolites: NEGATIVE ng/mL
Creatinine, Urine: 519.15 mg/dL (ref >20.0)
Fentanyl, Ur: NEGATIVE ng/mL
MDMA URINE: NEGATIVE ng/mL
MEPERIDINE UR: NEGATIVE ng/mL
METHADONE SCREEN, URINE: NEGATIVE ng/mL
METHAQUALONE SCREEN (URINE): NEGATIVE ng/mL
Nitrites, Initial: NEGATIVE ug/mL
Opiate Screen, Urine: NEGATIVE ng/mL
Oxycodone Screen, Ur: NEGATIVE ng/mL
PH URINE, INITIAL: 6 pH (ref 4.5–8.9)
PHENCYCLIDINE, UR: NEGATIVE ng/mL
Propoxyphene: NEGATIVE ng/mL
TAPENTADOLUR: NEGATIVE ng/mL
Tramadol Scrn, Ur: NEGATIVE ng/mL
Zolpidem, Urine: NEGATIVE ng/mL

## 2015-04-22 LAB — CANNABANOIDS (GC/LC/MS), URINE: THC-COOH (GC/LC/MS), ur confirm: 456 ng/mL — AB (ref <5)

## 2015-04-25 ENCOUNTER — Inpatient Hospital Stay (HOSPITAL_COMMUNITY)
Admission: AD | Admit: 2015-04-25 | Discharge: 2015-04-28 | DRG: 781 | Disposition: A | Discharge status: Home / Self Care | Payer: Medicaid Other | Source: Ambulatory Visit | Attending: Family Medicine | Admitting: Family Medicine

## 2015-04-25 ENCOUNTER — Other Ambulatory Visit: Payer: Self-pay | Admitting: General Practice

## 2015-04-25 ENCOUNTER — Encounter (HOSPITAL_COMMUNITY): Payer: Self-pay | Admitting: *Deleted

## 2015-04-25 ENCOUNTER — Ambulatory Visit (HOSPITAL_COMMUNITY)
Admission: RE | Admit: 2015-04-25 | Discharge: 2015-04-25 | Disposition: A | Discharge status: Home / Self Care | Payer: Medicaid Other | Source: Ambulatory Visit | Attending: Medical | Admitting: Medical

## 2015-04-25 DIAGNOSIS — O99323 Drug use complicating pregnancy, third trimester: Secondary | ICD-10-CM | POA: Diagnosis present

## 2015-04-25 DIAGNOSIS — O99324 Drug use complicating childbirth: Secondary | ICD-10-CM | POA: Insufficient documentation

## 2015-04-25 DIAGNOSIS — Z3A24 24 weeks gestation of pregnancy: Secondary | ICD-10-CM

## 2015-04-25 DIAGNOSIS — O0932 Supervision of pregnancy with insufficient antenatal care, second trimester: Secondary | ICD-10-CM

## 2015-04-25 DIAGNOSIS — O3430 Maternal care for cervical incompetence, unspecified trimester: Secondary | ICD-10-CM | POA: Diagnosis present

## 2015-04-25 DIAGNOSIS — O26872 Cervical shortening, second trimester: Secondary | ICD-10-CM | POA: Insufficient documentation

## 2015-04-25 DIAGNOSIS — Z36 Encounter for antenatal screening of mother: Secondary | ICD-10-CM | POA: Insufficient documentation

## 2015-04-25 DIAGNOSIS — F1210 Cannabis abuse, uncomplicated: Secondary | ICD-10-CM | POA: Diagnosis present

## 2015-04-25 DIAGNOSIS — O3432 Maternal care for cervical incompetence, second trimester: Principal | ICD-10-CM

## 2015-04-25 DIAGNOSIS — O99322 Drug use complicating pregnancy, second trimester: Secondary | ICD-10-CM

## 2015-04-25 LAB — CBC
HEMATOCRIT: 29.4 % — AB (ref 36.0–46.0)
HEMOGLOBIN: 9.6 g/dL — AB (ref 12.0–15.0)
MCH: 23.5 pg — AB (ref 26.0–34.0)
MCHC: 32.7 g/dL (ref 30.0–36.0)
MCV: 71.9 fL — AB (ref 78.0–100.0)
Platelets: 305 10*3/uL (ref 150–400)
RBC: 4.09 MIL/uL (ref 3.87–5.11)
RDW: 14.4 % (ref 11.5–15.5)
WBC: 10.8 10*3/uL — ABNORMAL HIGH (ref 4.0–10.5)

## 2015-04-25 LAB — TYPE AND SCREEN
ABO/RH(D): O POS
Antibody Screen: NEGATIVE

## 2015-04-25 LAB — WET PREP, GENITAL
Clue Cells Wet Prep HPF POC: NONE SEEN
SPERM: NONE SEEN
Trich, Wet Prep: NONE SEEN
Yeast Wet Prep HPF POC: NONE SEEN

## 2015-04-25 MED ORDER — BETAMETHASONE SOD PHOS & ACET 6 (3-3) MG/ML IJ SUSP
12.0000 mg | INTRAMUSCULAR | Status: AC
Start: 2015-04-25 — End: 2015-04-26
  Administered 2015-04-25 – 2015-04-26 (×2): 12 mg via INTRAMUSCULAR
  Filled 2015-04-25 (×2): qty 2

## 2015-04-25 MED ORDER — CALCIUM CARBONATE ANTACID 500 MG PO CHEW
2.0000 | CHEWABLE_TABLET | ORAL | Status: DC | PRN
  Administered 2015-04-25 – 2015-04-26 (×3): 400 mg via ORAL
  Filled 2015-04-25 (×3): qty 2

## 2015-04-25 MED ORDER — DOCUSATE SODIUM 100 MG PO CAPS
100.0000 mg | ORAL_CAPSULE | Freq: Every day | ORAL | Status: DC
  Administered 2015-04-26 – 2015-04-27 (×2): 100 mg via ORAL
  Filled 2015-04-25 (×3): qty 1

## 2015-04-25 MED ORDER — ACETAMINOPHEN 325 MG PO TABS: 650.0000 mg | ORAL_TABLET | ORAL | Status: DC | PRN

## 2015-04-25 MED ORDER — ZOLPIDEM TARTRATE 5 MG PO TABS: 5.0000 mg | ORAL_TABLET | Freq: Every evening | ORAL | Status: DC | PRN

## 2015-04-25 MED ORDER — PROGESTERONE MICRONIZED 200 MG PO CAPS
200.0000 mg | ORAL_CAPSULE | Freq: Every day | ORAL | Status: DC
  Administered 2015-04-25 – 2015-04-28 (×4): 200 mg via VAGINAL
  Filled 2015-04-25 (×4): qty 1

## 2015-04-25 MED ORDER — PRENATAL MULTIVITAMIN CH
1.0000 | ORAL_TABLET | Freq: Every day | ORAL | Status: DC
  Administered 2015-04-26 – 2015-04-27 (×2): 1 via ORAL
  Filled 2015-04-25 (×3): qty 1

## 2015-04-25 NOTE — H&P (Addendum)
Faculty Practice Antenatal History and Physical  Lindsey Bryan ZOX:[id number] DOB: January 01, 1991 DOA: 04/25/2015  Chief Complaint: Short cervix  HPI: Lindsey Bryan is a 24 y.o. female G1P0000 with IUP at [redacted]w[redacted]d.  She presented to maternal fetal medicine for a routine low risk ultrasound at 24 weeks and 0 days today. The patient had just established care 1 week ago and had a cervical exam which showed a long, thick, closed cervix. On the ultrasound, patient had funneling of the cervix down to the external os with no discernible thickness of the cervix. The patient has remained asymptomatic. The patient denies contractions, leaking fluid, vaginal discharge, vaginal pressure.   Review of Systems:   Review of systems are otherwise negative  Prenatal History/Complications: None  Past Medical History: Past Medical History  Diagnosis Date  . Headache(784.0)   . Migraines     Past Surgical History: Past Surgical History  Procedure Laterality Date  . Foot surgery    . Foot surgery      Obstetrical History: OB History    Gravida Para Term Preterm AB TAB SAB Ectopic Multiple Living        Gynecological History: OB History    Gravida Para Term Preterm AB TAB SAB Ectopic Multiple Living        Social History: Social History   Social History  . Marital Status: Single    Spouse Name: N/A  . Number of Children: N/A  . Years of Education: N/A   Social History Main Topics  . Smoking status: Never Smoker   . Smokeless tobacco: Never Used  . Alcohol Use: No  . Drug Use: No  . Sexual Activity: No   Other Topics Concern  . Not on file   Social History Narrative    Family History: Family History  Problem Relation Age of Onset  . Asthma Mother   . Diabetes Paternal Grandmother     Allergies: Allergies  Allergen Reactions  . Asa [Aspirin] Nausea And Vomiting  . Diazepam Other (See Comments)    Confused and dizzy     Prescriptions prior to admission  Medication Sig Dispense Refill Last Dose  . Prenatal Vit-Fe Fumarate-FA (PRENATAL COMPLETE) 14-0.4 MG TABS Take 1 tablet by mouth daily. 60 each 3 Taking    Physical Exam: LMP 10/27/2014 (Approximate)  LMP 10/27/2014 (Approximate) General appearance: alert, cooperative and no distress Head: Normocephalic, without obvious abnormality, atraumatic Eyes: Normal conjunctiva. Anicteric. Extraocular muscles are intact. Ears: Normal external ears bilaterally Neck: no adenopathy, no carotid bruit, no JVD, supple, symmetrical, trachea midline and thyroid not enlarged, symmetric, no tenderness/mass/nodules Lungs: clear to auscultation bilaterally Heart: regular rate and rhythm, S1, S2 normal, no murmur, click, rub or gallop Abdomen: soft, non-tender; bowel sounds normal; no masses,  no organomegaly Extremities: extremities normal, atraumatic, no cyanosis or edema Pulses: 2+ and symmetric Skin: Skin color, texture, turgor normal. No rashes or lesions Neurologic: Alert and oriented X 3, normal strength and tone. Normal symmetric reflexes. Normal coordination and gait cephalic  Dilation: Fingertip Effacement (%): 80 Exam by:: Stinson              Labs on Admission:  Basic Metabolic Panel: No results for input(s): NA, K, CL, CO2, GLUCOSE, BUN, CREATININE, CALCIUM, MG, PHOS in the last 168 hours. Liver Function Tests: No results for input(s): AST, ALT, ALKPHOS, BILITOT, PROT, ALBUMIN in the last 168 hours.  No results for input(s): LIPASE, AMYLASE in the last 168 hours. No results for input(s): AMMONIA in the last 168 hours. CBC: No results for input(s): WBC, NEUTROABS, HGB, HCT, MCV, PLT in the last 168 hours.  CBG: No results for input(s): GLUCAP in the last 168 hours.  Radiological Exams on Admission: [facility] Ob Transvaginal  04/25/2015  OBSTETRICAL ULTRASOUND: This exam was performed within a [facility]. The OB US report  was generated in the AS system, and faxed to the ordering physician.  This report is available in the [employer] PACS. See the AS Obstetric US report via the Image Link.  [facility] Ob Comp + 14 Wk  04/25/2015  OBSTETRICAL ULTRASOUND: This exam was performed within a [facility]. The OB US report was generated in the AS system, and faxed to the ordering physician.  This report is available in the [employer] PACS. See the AS Obstetric US report via the Image Link.    Assessment/Plan Present on Admission:  . Cervical incompetence, antepartum  Admit to antenatal  Betamethasone 12 mg IM every 24 hours 2 doses  Start vaginal Prometrium 200 mg at night  Modified bedrest  NST twice daily  Continuous tocometry  We'll obtain ultrasound in 2-3 days to recheck cervical length - possible pessary placement at that time  Vaginal cultures  GBS culture  Levie HeritageJacob J Stinson, DO 04/25/2015 4:43 PM Faculty Practice Attending Physician [facility] Attending Phone #: [phone]

## 2015-04-26 LAB — GC/CHLAMYDIA PROBE AMP (~~LOC~~) NOT AT ARMC
CHLAMYDIA, DNA PROBE: NEGATIVE
NEISSERIA GONORRHEA: NEGATIVE

## 2015-04-26 NOTE — Progress Notes (Signed)
Patient ID: Lindsey Bryan, female   DOB: 06/01/1990, 24 y.o.   MRN: [id number]  FACULTY PRACTICE ANTEPARTUM NOTE  Lindsey Bryan is a 24 y.o. G1P0000 at 66103w1d  who is admitted for incompetent cervix.   Fetal presentation is cephalic. Length of Stay:  1  Days  Subjective: Patient remains asymptomatic.  Has some heartburn improved with TUMS. Patient reports good fetal movement.   She reports no uterine contractions She reports no bleeding  She reports no loss of fluid per vagina.  Vitals:  Blood pressure 106/64, pulse 84, temperature 97.7 F (36.5 C), temperature source Oral, resp. rate 18, height 5\' 3"  (1.6 m), weight 189 lb (85.73 kg), last menstrual period 10/27/2014, SpO2 100 %. Physical Examination:  General appearance - alert, well appearing, and in no distress Mental status - alert, oriented to person, place, and time Chest - clear to auscultation, no wheezes, rales or rhonchi, symmetric air entry Abdomen - soft, nontender, nondistended, no masses or organomegaly Fundal Height:  size equals dates Extremities: extremities normal, atraumatic, no cyanosis or edema  Membranes:intact  Fetal Monitoring:  Baseline: 155 bpm, Variability: Good {> 6 bpm), Accelerations: Non-reactive but appropriate for gestational age and Decelerations: Absent  Labs:  Results for orders placed or performed during the hospital encounter of 04/25/15 (from the past 24 hour(s))  CBC   Collection Time: 04/25/15  6:24 PM  Result Value Ref Range   WBC 10.8 (H) 4.0 - 10.5 K/uL   RBC 4.09 3.87 - 5.11 MIL/uL   Hemoglobin 9.6 (L) 12.0 - 15.0 g/dL   HCT 40.929.4 (L) 81.136.0 - 91.446.0 %   MCV 71.9 (L) 78.0 - 100.0 fL   MCH 23.5 (L) 26.0 - 34.0 pg   MCHC 32.7 30.0 - 36.0 g/dL   RDW 78.214.4 95.611.5 - 21.315.5 %   Platelets 305 150 - 400 K/uL  Type and screen [facility]   Collection Time: 04/25/15  6:24 PM  Result Value Ref Range   ABO/RH(D) O POS    Antibody Screen NEG    Sample Expiration 04/28/2015    Wet prep, genital   Collection Time: 04/25/15  8:18 PM  Result Value Ref Range   Yeast Wet Prep HPF POC NONE SEEN NONE SEEN   Trich, Wet Prep NONE SEEN NONE SEEN   Clue Cells Wet Prep HPF POC NONE SEEN NONE SEEN   WBC, Wet Prep HPF POC FEW (A) NONE SEEN   Sperm NONE SEEN     Imaging Studies:       Medications:  Scheduled . betamethasone acetate-betamethasone sodium phosphate  12 mg Intramuscular Q24H  . docusate sodium  100 mg Oral Daily  . prenatal multivitamin  1 tablet Oral Q1200  . progesterone  200 mg Vaginal QHS   I have reviewed the patient's current medications.  ASSESSMENT: Patient Active Problem List   Diagnosis Date Noted  . Cervical incompetence, antepartum 04/25/2015  . Marijuana use 04/22/2015  . Supervision of low-risk first pregnancy 04/18/2015    PLAN: 1.  Cervical Incompetence  FHT reassuring.   No ctxs.    Continue prometrium  Gc/ct, gbs pending  BMZ second dose this afternoon.    Repeat US for Thursday. Continue routine antenatal care.   Levie HeritageJacob J Stinson, DO 04/26/2015,5:06 AM

## 2015-04-27 LAB — CULTURE, BETA STREP (GROUP B ONLY)

## 2015-04-27 NOTE — Progress Notes (Signed)
Patient ID: Lindsey Bryan, female   DOB: 1991-02-19, 24 y.o.   MRN: [id number] FACULTY PRACTICE ANTEPARTUM(COMPREHENSIVE) NOTE  Lindsey Bryan is a 24 y.o. G1P0000 at 8537w2d  who is admitted for induction of labor due to .Marland Kitchen.   Length of Stay:  2  Days  Subjective: Patient reports the fetal movement as active. Patient reports uterine contraction  activity as none. Patient reports  vaginal bleeding as none. Patient describes fluid per vagina as None.  Vitals:  Blood pressure 100/63, pulse 69, temperature 98.1 F (36.7 C), temperature source Oral, resp. rate 18, height 5\' 3"  (1.6 m), weight 189 lb (85.73 kg), last menstrual period 10/27/2014, SpO2 100 %. Physical Examination:  General appearance - alert, well appearing, and in no distress Abdominal--gravid, non tender Extremities--negative homan's  Fetal Monitoring:  Baseline: 150 bpm, Variability: Good {> 6 bpm), Accelerations: Reactive and Decelerations: one variable with good return to baseline  No contractions  Labs:  No results found for this or any previous visit (from the past 24 hour(s)).   Medications:  Scheduled . docusate sodium  100 mg Oral Daily  . prenatal multivitamin  1 tablet Oral Q1200  . progesterone  200 mg Vaginal QHS   I have reviewed the patient's current medications.  ASSESSMENT: Patient Active Problem List   Diagnosis Date Noted  . Cervical incompetence, antepartum 04/25/2015  . Marijuana use 04/22/2015  . Supervision of low-risk first pregnancy 04/18/2015    PLAN: Nightly Prometrium Rpt US Thursday with possible pessary NST bid  Azile Minardi H. 04/27/2015,5:59 AM

## 2015-04-28 ENCOUNTER — Inpatient Hospital Stay (HOSPITAL_COMMUNITY): Payer: Medicaid Other

## 2015-04-28 DIAGNOSIS — O99323 Drug use complicating pregnancy, third trimester: Secondary | ICD-10-CM | POA: Diagnosis present

## 2015-04-28 DIAGNOSIS — O3432 Maternal care for cervical incompetence, second trimester: Principal | ICD-10-CM | POA: Diagnosis present

## 2015-04-28 DIAGNOSIS — Z3A24 24 weeks gestation of pregnancy: Secondary | ICD-10-CM

## 2015-04-28 DIAGNOSIS — F1210 Cannabis abuse, uncomplicated: Secondary | ICD-10-CM | POA: Diagnosis present

## 2015-04-28 LAB — TYPE AND SCREEN
ABO/RH(D): O POS
Antibody Screen: NEGATIVE

## 2015-04-28 MED ORDER — PROGESTERONE MICRONIZED 200 MG PO CAPS: 200.0000 mg | ORAL_CAPSULE | Freq: Every day | ORAL | Status: DC

## 2015-04-28 NOTE — Discharge Summary (Addendum)
OB Discharge Summary     Patient Name: Lindsey Bryan DOB: January 20, 1991 MRN: [id number]  Date of admission: 04/25/2015 Delivering MD: This patient has no babies on file.  Date of discharge: 04/28/2015  Admitting diagnosis: 24w admit to antenatal Intrauterine pregnancy: 7432w3d     Secondary diagnosis:  Principal Problem:   Cervical incompetence, antepartum  Additional problems: none     Discharge diagnosis: same                                                                                                Hospital course:  This is a G1 at 7632w3d today who was admitted on 12/12 for cervical insufficiency, discovered incidentally on routine US.  She was started on prometrium, given 2 doses of betamethasone, and had a repeat US of her cervix today.  Her US today showed a cervical length of 7mm, whereas 4 days ago, her cervix had no disernible length.  While we discussed the possibility of placing a pessary, the patient declined.  At the time of discharge, I did not have the MFM report in hand, but reviewed the US images myself.  Further, I did not have the opportunity to discuss with MFM whether she thinks sending the patient home would be recommended at this point.  However, the patient and her partner were insistent upon being sent home.  The patient will be discharged on prometrium to follow up in the clinic next week.  I discussed with her that she should return immediately with any preterm labor symptoms: cramping, contractions, bleeding, pressure, pain, etc.  I also discussed absolute pelvic rest and modified bedrest with no heavy lifting.  Physical exam  Filed Vitals:   04/28/15 0855 04/28/15 1630 04/28/15 1631 04/28/15 2014  BP: 98/50  104/65 110/80  Pulse: 67  84 68  Temp: 98.5 F (36.9 C) 98.7 F (37.1 C)  98.7 F (37.1 C)  TempSrc: Oral Oral  Oral  Resp: 20 18    Height:      Weight:      SpO2:       See exam by Dr Emelda FearFerguson.  Labs: Lab Results  Component Value Date   WBC 10.8* 04/25/2015   HGB 9.6* 04/25/2015   HCT 29.4* 04/25/2015   MCV 71.9* 04/25/2015   PLT 305 04/25/2015   CMP Latest Ref Rng 04/10/2015  Glucose 65 - 99 mg/dL 86  BUN 6 - 20 mg/dL 6  Creatinine 4.090.44 - 8.111.00 mg/dL 9.140.64  Sodium 782135 - 956145 mmol/L 137  Potassium 3.5 - 5.1 mmol/L 3.5  Chloride 101 - 111 mmol/L 107  CO2 22 - 32 mmol/L 23  Calcium 8.9 - 10.3 mg/dL 8.9  Total Protein 6.5 - 8.1 g/dL 7.0  Total Bilirubin 0.3 - 1.2 mg/dL 0.5  Alkaline Phos 38 - 126 U/L 75  AST 15 - 41 U/L 14(L)  ALT 14 - 54 U/L 14    Discharge instruction: per After Visit Summary and "Baby and Me Booklet".  After visit meds:    Medication List    TAKE these medications  calcium carbonate 500 MG chewable tablet  Commonly known as:  TUMS - dosed in mg elemental calcium  Chew 1-2 tablets by mouth 3 (three) times daily as needed for indigestion or heartburn.     PRENATAL COMPLETE 14-0.4 MG Tabs  Take 1 tablet by mouth daily.     progesterone 200 MG capsule  Commonly known as:  PROMETRIUM  Place 1 capsule (200 mg total) vaginally at bedtime.        Diet: routine diet  Activity: Modified bedrest.  Pelvic rest.  Outpatient follow up: next week Follow up Appt:Future Appointments Date Time Provider Department Center  05/16/2015 2:00 PM Hurshel Party, CNM [facility]    04/28/2015 STINSON, JACOB JEHIEL, DO

## 2015-04-28 NOTE — Progress Notes (Signed)
Patient to US

## 2015-04-28 NOTE — Progress Notes (Signed)
Patient ID: Lindsey Bryan, female   DOB: 10-31-1990, 24 y.o.   MRN: [id number] FACULTY PRACTICE ANTEPARTUM(COMPREHENSIVE) NOTE  Lindsey BeamSelyna Baldinger is a 24 y.o. G1P0000 at 5118w3d  who is admitted for short cervix, for consideration of pessary.   Fetal presentation is unsure. Length of Stay:  3  Days  Subjective: Slept well thru the night without contractions Patient reports the fetal movement as active. Patient reports uterine contraction  activity as none. Patient reports  vaginal bleeding as none. Patient describes fluid per vagina as None.  Vitals:  Blood pressure 95/65, pulse 77, temperature 99.1 F (37.3 C), temperature source Oral, resp. rate 17, height 5\' 3"  (1.6 m), weight 85.186 kg (187 lb 12.8 oz), last menstrual period 10/27/2014, SpO2 100 %. Physical Examination:  General appearance - alert, well appearing, and in no distress Heart - normal rate and regular rhythm Abdomen - soft, nontender, nondistended Fundal Height:  size equals dates Cervical Exam: Not evaluated.  Extremities: extremities normal, atraumatic, no cyanosis or edema and Homans sign is negative, no sign of DVT with DTRs 2+ bilaterally Membranes:intact  Fetal Monitoring:  Normal q shift  Labs:  No results found for this or any previous visit (from the past 24 hour(s)).  Imaging Studies:     Currently EPIC will not allow sonographic studies to automatically populate into notes.  In the meantime, copy and paste results into note or free text.  Medications:  Scheduled . docusate sodium  100 mg Oral Daily  . prenatal multivitamin  1 tablet Oral Q1200  . progesterone  200 mg Vaginal QHS   I have reviewed the patient's current medications.  ASSESSMENT: Patient Active Problem List   Diagnosis Date Noted  . Cervical incompetence, antepartum 04/25/2015  . Marijuana use 04/22/2015  . Supervision of low-risk first pregnancy 04/18/2015    PLAN: Repeat u/s this a.m. , and trial pessary to be  offered.  Daryle Boyington V 04/28/2015,7:10 AM

## 2015-04-28 NOTE — Discharge Instructions (Signed)
Preterm Labor Information °Preterm labor is when labor starts before you are [redacted] weeks pregnant. The normal length of pregnancy is 39 to 41 weeks.  °CAUSES  °The cause of preterm labor is not often known. The most common known cause is infection. °RISK FACTORS °· Having a history of preterm labor. °· Having your water break before it should. °· Having a placenta that covers the opening of the cervix. °· Having a placenta that breaks away from the uterus. °· Having a cervix that is too weak to hold the baby in the uterus. °· Having too much fluid in the amniotic sac. °· Taking drugs or smoking while pregnant. °· Not gaining enough weight while pregnant. °· Being younger than 18 and older than 22 years old. °· Having a low income. °· Being African American. °SYMPTOMS °· Period-like cramps, belly (abdominal) pain, or back pain. °· Contractions that are regular, as often as six in an hour. They may be mild or painful. °· Contractions that start at the top of the belly. They then move to the lower belly and back. °· Lower belly pressure that seems to get stronger. °· Bleeding from the vagina. °· Fluid leaking from the vagina. °TREATMENT  °Treatment depends on: °· Your condition. °· The condition of your baby. °· How many weeks pregnant you are. °Your doctor may have you: °· Take medicine to stop contractions. °· Stay in bed except to use the restroom (bed rest). °· Stay in the hospital. °WHAT SHOULD YOU DO IF YOU THINK YOU ARE IN PRETERM LABOR? °Call your doctor right away. You need to go to the hospital right away.  °HOW CAN YOU PREVENT PRETERM LABOR IN FUTURE PREGNANCIES? °· Stop smoking, if you smoke. °· Maintain healthy weight gain. °· Do not take drugs or be around chemicals that are not needed. °· Tell your doctor if you think you have an infection. °· Tell your doctor if you had a preterm labor before. °  °This information is not intended to replace advice given to you by your health care provider. Make sure you  discuss any questions you have with your health care provider. °  °Document Released: 07/27/2008 Document Revised: 09/14/2014 Document Reviewed: 06/02/2012 °Elsevier Interactive Patient Education ©2016 [employer] Inc[employer] ° °

## 2015-05-05 ENCOUNTER — Encounter: Payer: Medicaid Other | Admitting: Obstetrics & Gynecology

## 2015-05-11 ENCOUNTER — Encounter: Payer: Medicaid Other | Admitting: Obstetrics and Gynecology

## 2015-05-11 ENCOUNTER — Telehealth: Payer: Self-pay | Admitting: Obstetrics and Gynecology

## 2015-05-11 NOTE — Telephone Encounter (Signed)
Called patient about missed appointment, and left message for new appointment.

## 2015-05-15 NOTE — L&D Delivery Note (Signed)
Delivery Note At 9:05 AM a viable and healthy female was delivered via Vaginal, Spontaneous Delivery (Presentation: LOA ).  Loose nuchal x 2 reduced easily.  APGAR: 8, 9; weight pending .   Placenta status: intact spontaneous.  Cord:  3 vessel with the following complications: none.   Anesthesia: None  Episiotomy:  none Lacerations:  None, intact Suture Repair: n/a Est. Blood Loss (mL):  300   Mom to postpartum.  Baby to Couplet care / Skin to Skin.  Lindsey Bryan is a 25 y.o. female G1P0000 with IUP at 4176w2d who arrived to MAU reporting urge to push and RN called NP and CNM to room.  Infant delivered precipitously by Judeth HornErin Lawrence, NP while CNM entering room.   Cord clamping delayed by several minutes then clamped by CNM and cut by FOB.  Placenta intact and spontaneous, bleeding minimal.  Intact perineum.  Mom and baby stable prior to transfer to postpartum. She plans on breastfeeding.     [name], [name] 08/03/2015, 10:20 AM

## 2015-05-16 ENCOUNTER — Encounter: Payer: Medicaid Other | Admitting: Advanced Practice Midwife

## 2015-05-19 ENCOUNTER — Encounter: Payer: Medicaid Other | Admitting: Family

## 2015-05-19 ENCOUNTER — Encounter: Payer: Self-pay | Admitting: Obstetrics & Gynecology

## 2015-05-19 ENCOUNTER — Telehealth: Payer: Self-pay | Admitting: Obstetrics & Gynecology

## 2015-05-19 NOTE — Telephone Encounter (Signed)
Called patient ,and left message on both phones for her to call and reschedule. [facility].

## 2015-06-19 ENCOUNTER — Encounter (HOSPITAL_COMMUNITY): Payer: Self-pay | Admitting: *Deleted

## 2015-06-19 ENCOUNTER — Emergency Department (HOSPITAL_COMMUNITY)
Admission: EM | Admit: 2015-06-19 | Discharge: 2015-06-19 | Discharge status: Against Medical Advice | Payer: Medicaid Other | Attending: Emergency Medicine | Admitting: Emergency Medicine

## 2015-06-19 DIAGNOSIS — R1031 Right lower quadrant pain: Secondary | ICD-10-CM | POA: Diagnosis not present

## 2015-06-19 DIAGNOSIS — O9989 Other specified diseases and conditions complicating pregnancy, childbirth and the puerperium: Secondary | ICD-10-CM | POA: Diagnosis not present

## 2015-06-19 DIAGNOSIS — Z3A28 28 weeks gestation of pregnancy: Secondary | ICD-10-CM | POA: Insufficient documentation

## 2015-06-19 NOTE — ED Notes (Signed)
Pt reports sudden onset of sharp pain in her RLQ today while getting up.  Pt is [redacted] weeks pregnant.  Denies any bleeding at this time.  Pt reports pain is constant sharp pain.

## 2015-06-19 NOTE — ED Provider Notes (Signed)
Patient called for room assignment without answer.  Patient LWBS after triage.  I did not see or evaluate patient.  Garlon Hatchet, PA-C 06/19/15 1650  Mancel Bale, MD 06/19/15 [phone]

## 2015-06-19 NOTE — ED Notes (Signed)
Called x 2 no answer

## 2015-06-20 ENCOUNTER — Inpatient Hospital Stay (HOSPITAL_COMMUNITY)
Admission: AD | Admit: 2015-06-20 | Discharge: 2015-06-20 | Disposition: A | Discharge status: Home / Self Care | Source: Ambulatory Visit | Attending: Obstetrics and Gynecology | Admitting: Obstetrics and Gynecology

## 2015-06-20 ENCOUNTER — Encounter (HOSPITAL_COMMUNITY): Payer: Self-pay | Admitting: Student

## 2015-06-20 DIAGNOSIS — R102 Pelvic and perineal pain: Secondary | ICD-10-CM | POA: Diagnosis present

## 2015-06-20 DIAGNOSIS — O4703 False labor before 37 completed weeks of gestation, third trimester: Secondary | ICD-10-CM | POA: Insufficient documentation

## 2015-06-20 DIAGNOSIS — O26893 Other specified pregnancy related conditions, third trimester: Secondary | ICD-10-CM | POA: Diagnosis not present

## 2015-06-20 DIAGNOSIS — M549 Dorsalgia, unspecified: Secondary | ICD-10-CM | POA: Diagnosis not present

## 2015-06-20 DIAGNOSIS — Z825 Family history of asthma and other chronic lower respiratory diseases: Secondary | ICD-10-CM | POA: Diagnosis not present

## 2015-06-20 DIAGNOSIS — O9989 Other specified diseases and conditions complicating pregnancy, childbirth and the puerperium: Secondary | ICD-10-CM

## 2015-06-20 DIAGNOSIS — Z888 Allergy status to other drugs, medicaments and biological substances status: Secondary | ICD-10-CM | POA: Insufficient documentation

## 2015-06-20 DIAGNOSIS — Z3A32 32 weeks gestation of pregnancy: Secondary | ICD-10-CM | POA: Insufficient documentation

## 2015-06-20 DIAGNOSIS — Z886 Allergy status to analgesic agent status: Secondary | ICD-10-CM | POA: Diagnosis not present

## 2015-06-20 DIAGNOSIS — O3433 Maternal care for cervical incompetence, third trimester: Secondary | ICD-10-CM | POA: Insufficient documentation

## 2015-06-20 DIAGNOSIS — O99891 Other specified diseases and conditions complicating pregnancy: Secondary | ICD-10-CM

## 2015-06-20 LAB — URINE MICROSCOPIC-ADD ON

## 2015-06-20 LAB — URINALYSIS, ROUTINE W REFLEX MICROSCOPIC
Glucose, UA: NEGATIVE mg/dL
HGB URINE DIPSTICK: NEGATIVE
Ketones, ur: >80 mg/dL — AB
Nitrite: NEGATIVE
Protein, ur: 30 mg/dL — AB
SPECIFIC GRAVITY, URINE: 1.025 (ref 1.005–1.030)
pH: 6.5 (ref 5.0–8.0)

## 2015-06-20 LAB — FETAL FIBRONECTIN: FETAL FIBRONECTIN: POSITIVE — AB

## 2015-06-20 MED ORDER — ACETAMINOPHEN 325 MG PO TABS
650.0000 mg | ORAL_TABLET | Freq: Once | ORAL | Status: AC
  Administered 2015-06-20: 650 mg via ORAL
  Filled 2015-06-20: qty 2

## 2015-06-20 MED ORDER — NIFEDIPINE ER OSMOTIC RELEASE 30 MG PO TB24
30.0000 mg | ORAL_TABLET | Freq: Every day | ORAL | Status: DC
Start: 2015-06-20 — End: 2015-08-03

## 2015-06-20 MED ORDER — NIFEDIPINE ER OSMOTIC RELEASE 30 MG PO TB24
30.0000 mg | ORAL_TABLET | Freq: Once | ORAL | Status: AC
  Administered 2015-06-20: 30 mg via ORAL
  Filled 2015-06-20: qty 1

## 2015-06-20 MED ORDER — BETAMETHASONE SOD PHOS & ACET 6 (3-3) MG/ML IJ SUSP
12.0000 mg | Freq: Once | INTRAMUSCULAR | Status: AC
  Administered 2015-06-20: 12 mg via INTRAMUSCULAR
  Filled 2015-06-20: qty 2

## 2015-06-20 MED ORDER — DEXTROSE 5 % IN LACTATED RINGERS IV BOLUS: 1000.0000 mL | Freq: Once | INTRAVENOUS | Status: DC

## 2015-06-20 MED ORDER — ONDANSETRON 8 MG PO TBDP
8.0000 mg | ORAL_TABLET | Freq: Once | ORAL | Status: AC
  Administered 2015-06-20: 8 mg via ORAL
  Filled 2015-06-20: qty 1

## 2015-06-20 NOTE — MAU Note (Signed)
Patient c/o pelvic and back pain that started yesterday and went away, but returns today and has been constant since this morning.  Patient states she has not been having contractions, denies LOF or vaginal bleeding. Patient reports +fetal movement.

## 2015-06-20 NOTE — Discharge Instructions (Signed)

## 2015-06-20 NOTE — MAU Provider Note (Signed)
History     CSN: [id number]  Arrival date and time: 06/20/15 1743   First Provider Initiated Contact with Patient 06/20/15 1839       Chief Complaint  Patient presents with  . Pelvic Pain  . Back Pain   HPI  Lindsey Bryan is a 25 y.o. G1P0000 at [redacted]w[redacted]d who presents with pelvic pain & back pain.  Symptoms started yesterday. Reports pelvic pain that radiates to vagina. Pain comes & goes with movement & position changes. Low back pain in constant & stabbing. Rates pain 8/10. No treatment.  Denies urinary complaints, LOF, vaginal bleeding, diarrhea or constipation.  Some nausea, no vomiting.  Denies fever.  Has been on pelvic rest since admission for incompetent cervix in December. No intercourse.  States has missed last 3 appointments for prenatal care b/c her mom wasn't getting the messages to her in time.   OB History    Gravida Para Term Preterm AB TAB SAB Ectopic Multiple Living        Past Medical History  Diagnosis Date  . Headache(784.0)   . Migraines     Past Surgical History  Procedure Laterality Date  . Foot surgery      Family History  Problem Relation Age of Onset  . Asthma Mother   . Diabetes Paternal Grandmother     Social History  Substance Use Topics  . Smoking status: Never Smoker   . Smokeless tobacco: Never Used  . Alcohol Use: No    Allergies:  Allergies  Allergen Reactions  . Asa [Aspirin] Nausea And Vomiting  . Diazepam Other (See Comments)    Confused and dizzy    Prescriptions prior to admission  Medication Sig Dispense Refill Last Dose  . calcium carbonate (TUMS - DOSED IN MG ELEMENTAL CALCIUM) 500 MG chewable tablet Chew 1 tablet by mouth as needed for indigestion or heartburn. Patient takes up to 5 times daily.   06/19/2015 at Unknown time  . Prenatal Vit-Fe Fumarate-FA (PRENATAL COMPLETE) 14-0.4 MG TABS Take 1 tablet by mouth daily. 60 each 3 06/19/2015 at Unknown time  . progesterone (PROMETRIUM) 200 MG  capsule Place 1 capsule (200 mg total) vaginally at bedtime. (Patient not taking: Reported on 06/19/2015) 30 capsule 3 Not Taking at Unknown time    Review of Systems  Constitutional: Negative.   Gastrointestinal: Positive for nausea and abdominal pain. Negative for vomiting, diarrhea and constipation.  Genitourinary: Negative.  Negative for dysuria and flank pain.  Musculoskeletal: Positive for back pain. Negative for falls.   Physical Exam   Blood pressure 113/67, pulse 82, temperature 98.2 F (36.8 C), temperature source Oral, resp. rate 18, height  (1.6 m), weight 188 lb 12.8 oz (85.639 kg), last menstrual period 10/27/2014, SpO2 99 %.  Physical Exam  Nursing note and vitals reviewed. Constitutional: She is oriented to person, place, and time. She appears well-developed and well-nourished. No distress.  HENT:  Head: Normocephalic and atraumatic.  Eyes: Conjunctivae are normal. Right eye exhibits no discharge. Left eye exhibits no discharge. No scleral icterus.  Neck: Normal range of motion.  Cardiovascular: Normal rate, regular rhythm and normal heart sounds.   No murmur heard. Respiratory: Effort normal and breath sounds normal. No respiratory distress. She has no wheezes.  GI: Soft. There is no tenderness. There is no CVA tenderness.  Neurological: She is alert and oriented to person, place, and time.  Skin: Skin is warm and dry. She  is not diaphoretic.  Psychiatric: She has a normal mood and affect. Her behavior is normal. Judgment and thought content normal.   Dilation: 1 Effacement (%): 80 Station: Ballotable Exam by:: Estanislado Spire, NP   Fetal Tracing:  Baseline: 150 Variability: moderate Accelerations: 10x10 Decelerations: none  Toco: irregular UI   MAU Course  Procedures Results for orders placed or performed during the hospital encounter of 06/20/15 (from the past 24 hour(s))  Urinalysis, Routine w reflex microscopic (not at [facility])     Status: Abnormal    Collection Time: 06/20/15  6:05 PM  Result Value Ref Range   Color, Urine YELLOW YELLOW   APPearance HAZY (A) CLEAR   Specific Gravity, Urine 1.025 1.005 - 1.030   pH 6.5 5.0 - 8.0   Glucose, UA NEGATIVE NEGATIVE mg/dL   Hgb urine dipstick NEGATIVE NEGATIVE   Bilirubin Urine SMALL (A) NEGATIVE   Ketones, ur >80 (A) NEGATIVE mg/dL   Protein, ur 30 (A) NEGATIVE mg/dL   Nitrite NEGATIVE NEGATIVE   Leukocytes, UA SMALL (A) NEGATIVE  Urine microscopic-add on     Status: Abnormal   Collection Time: 06/20/15  6:05 PM  Result Value Ref Range   Squamous Epithelial / LPF 6-30 (A) NONE SEEN   WBC, UA 0-5 0 - 5 WBC/hpf   RBC / HPF 0-5 0 - 5 RBC/hpf   Bacteria, UA MANY (A) NONE SEEN   Urine-Other MUCOUS PRESENT   Fetal fibronectin     Status: Abnormal   Collection Time: 06/20/15  6:55 PM  Result Value Ref Range   Fetal Fibronectin POSITIVE (A) NEGATIVE    MDM Tylenol & zofran given PO Reactive tracing with irregular UI Cervix 1/8/ballotable today - was FT/80% in December D5LR bolus ordered - but IV attempts unsuccessful x 2 so given pitcher of water FFN positive S/w Dr. Jolayne Panther. Outpatient steroids & procardia xl to take at home.  First dose of BMZ & procardia given in MAU Assessment and Plan  A: 1. Preterm contractions, third trimester   2. Back pain affecting pregnancy in third trimester     P: Discharge home Rx procardia xl 30 mg to be started tomorrow evening Return tomorrow evening for 2nd BMZ Continue pelvic rest Increase fluid intake Wear maternity support belt Call clinic to reschedule appointment  Judeth Horn, NP  06/20/2015, 6:39 PM

## 2015-06-21 ENCOUNTER — Inpatient Hospital Stay (HOSPITAL_COMMUNITY)
Admission: AD | Admit: 2015-06-21 | Discharge: 2015-06-21 | Disposition: A | Discharge status: Home / Self Care | Payer: Medicaid Other | Source: Ambulatory Visit | Attending: Family Medicine | Admitting: Family Medicine

## 2015-06-21 ENCOUNTER — Encounter: Payer: Self-pay | Admitting: Family Medicine

## 2015-06-21 MED ORDER — BETAMETHASONE SOD PHOS & ACET 6 (3-3) MG/ML IJ SUSP
12.0000 mg | Freq: Once | INTRAMUSCULAR | Status: AC
  Administered 2015-06-21: 12 mg via INTRAMUSCULAR
  Filled 2015-06-21: qty 2

## 2015-06-21 NOTE — MAU Note (Signed)
Pt reports she is a lot better today. Denies bleeding. Denies pain.

## 2015-06-27 ENCOUNTER — Encounter: Admitting: Obstetrics & Gynecology

## 2015-08-03 ENCOUNTER — Encounter (HOSPITAL_COMMUNITY): Payer: Self-pay | Admitting: *Deleted

## 2015-08-03 ENCOUNTER — Inpatient Hospital Stay (HOSPITAL_COMMUNITY)
Admission: AD | Admit: 2015-08-03 | Discharge: 2015-08-04 | DRG: 775 | Disposition: A | Discharge status: Home / Self Care | Payer: Medicaid Other | Source: Ambulatory Visit | Attending: Obstetrics & Gynecology | Admitting: Obstetrics & Gynecology

## 2015-08-03 DIAGNOSIS — O3433 Maternal care for cervical incompetence, third trimester: Secondary | ICD-10-CM | POA: Diagnosis present

## 2015-08-03 DIAGNOSIS — Z3A38 38 weeks gestation of pregnancy: Secondary | ICD-10-CM

## 2015-08-03 DIAGNOSIS — Z825 Family history of asthma and other chronic lower respiratory diseases: Secondary | ICD-10-CM | POA: Diagnosis not present

## 2015-08-03 DIAGNOSIS — IMO0001 Reserved for inherently not codable concepts without codable children: Secondary | ICD-10-CM

## 2015-08-03 DIAGNOSIS — Z833 Family history of diabetes mellitus: Secondary | ICD-10-CM

## 2015-08-03 LAB — CBC
HEMATOCRIT: 31.2 % — AB (ref 36.0–46.0)
Hemoglobin: 10.1 g/dL — ABNORMAL LOW (ref 12.0–15.0)
MCH: 22.1 pg — ABNORMAL LOW (ref 26.0–34.0)
MCHC: 32.4 g/dL (ref 30.0–36.0)
MCV: 68.3 fL — ABNORMAL LOW (ref 78.0–100.0)
PLATELETS: 191 10*3/uL (ref 150–400)
RBC: 4.57 MIL/uL (ref 3.87–5.11)
RDW: 15.8 % — AB (ref 11.5–15.5)
WBC: 12.8 10*3/uL — AB (ref 4.0–10.5)

## 2015-08-03 LAB — TYPE AND SCREEN
ABO/RH(D): O POS
ANTIBODY SCREEN: NEGATIVE

## 2015-08-03 MED ORDER — ZOLPIDEM TARTRATE 5 MG PO TABS: 5.0000 mg | ORAL_TABLET | Freq: Every evening | ORAL | Status: DC | PRN

## 2015-08-03 MED ORDER — OXYCODONE-ACETAMINOPHEN 5-325 MG PO TABS
2.0000 | ORAL_TABLET | ORAL | Status: DC | PRN
  Administered 2015-08-03: 2 via ORAL
  Filled 2015-08-03: qty 2

## 2015-08-03 MED ORDER — OXYTOCIN 10 UNIT/ML IJ SOLN
10.0000 [IU] | Freq: Once | INTRAMUSCULAR | Status: AC
  Administered 2015-08-03: 10 [IU] via INTRAMUSCULAR

## 2015-08-03 MED ORDER — ACETAMINOPHEN 325 MG PO TABS: 650.0000 mg | ORAL_TABLET | ORAL | Status: DC | PRN

## 2015-08-03 MED ORDER — ONDANSETRON HCL 4 MG/2ML IJ SOLN: 4.0000 mg | Freq: Four times a day (QID) | INTRAMUSCULAR | Status: DC | PRN

## 2015-08-03 MED ORDER — FLEET ENEMA 7-19 GM/118ML RE ENEM: 1.0000 | ENEMA | Freq: Every day | RECTAL | Status: DC | PRN

## 2015-08-03 MED ORDER — DIBUCAINE 1 % RE OINT: 1.0000 "application " | TOPICAL_OINTMENT | RECTAL | Status: DC | PRN

## 2015-08-03 MED ORDER — LIDOCAINE HCL (PF) 1 % IJ SOLN
30.0000 mL | INTRAMUSCULAR | Status: DC | PRN
Start: 2015-08-03 — End: 2015-08-03
  Filled 2015-08-03: qty 30

## 2015-08-03 MED ORDER — MISOPROSTOL 200 MCG PO TABS
800.0000 ug | ORAL_TABLET | Freq: Once | ORAL | Status: AC
  Administered 2015-08-03: 800 ug via ORAL
  Filled 2015-08-03: qty 4

## 2015-08-03 MED ORDER — OXYTOCIN 10 UNIT/ML IJ SOLN
INTRAMUSCULAR | Status: AC
  Filled 2015-08-03: qty 1

## 2015-08-03 MED ORDER — MEASLES, MUMPS & RUBELLA VAC ~~LOC~~ INJ
0.5000 mL | INJECTION | Freq: Once | SUBCUTANEOUS | Status: DC
  Filled 2015-08-03: qty 0.5

## 2015-08-03 MED ORDER — TETANUS-DIPHTH-ACELL PERTUSSIS 5-2.5-18.5 LF-MCG/0.5 IM SUSP: 0.5000 mL | Freq: Once | INTRAMUSCULAR | Status: DC

## 2015-08-03 MED ORDER — ONDANSETRON HCL 4 MG/2ML IJ SOLN: 4.0000 mg | INTRAMUSCULAR | Status: DC | PRN

## 2015-08-03 MED ORDER — LANOLIN HYDROUS EX OINT: TOPICAL_OINTMENT | CUTANEOUS | Status: DC | PRN

## 2015-08-03 MED ORDER — IBUPROFEN 600 MG PO TABS
600.0000 mg | ORAL_TABLET | Freq: Four times a day (QID) | ORAL | Status: DC | PRN
  Administered 2015-08-03 – 2015-08-04 (×3): 600 mg via ORAL
  Filled 2015-08-03 (×4): qty 1

## 2015-08-03 MED ORDER — LACTATED RINGERS IV SOLN: INTRAVENOUS | Status: DC

## 2015-08-03 MED ORDER — OXYCODONE-ACETAMINOPHEN 5-325 MG PO TABS: 1.0000 | ORAL_TABLET | ORAL | Status: DC | PRN

## 2015-08-03 MED ORDER — LACTATED RINGERS IV SOLN: 2.5000 [IU]/h | INTRAVENOUS | Status: DC

## 2015-08-03 MED ORDER — METHYLERGONOVINE MALEATE 0.2 MG PO TABS: 0.2000 mg | ORAL_TABLET | ORAL | Status: DC | PRN

## 2015-08-03 MED ORDER — FLEET ENEMA 7-19 GM/118ML RE ENEM: 1.0000 | ENEMA | RECTAL | Status: DC | PRN

## 2015-08-03 MED ORDER — METHYLERGONOVINE MALEATE 0.2 MG/ML IJ SOLN: 0.2000 mg | INTRAMUSCULAR | Status: DC | PRN

## 2015-08-03 MED ORDER — OXYTOCIN 10 UNIT/ML IJ SOLN: 2.5000 [IU]/h | INTRAVENOUS | Status: DC | PRN

## 2015-08-03 MED ORDER — PRENATAL MULTIVITAMIN CH
1.0000 | ORAL_TABLET | Freq: Every day | ORAL | Status: DC
  Administered 2015-08-03: 1 via ORAL
  Filled 2015-08-03 (×2): qty 1

## 2015-08-03 MED ORDER — OXYTOCIN BOLUS FROM INFUSION: 500.0000 mL | INTRAVENOUS | Status: DC

## 2015-08-03 MED ORDER — SIMETHICONE 80 MG PO CHEW: 80.0000 mg | CHEWABLE_TABLET | ORAL | Status: DC | PRN

## 2015-08-03 MED ORDER — DIPHENHYDRAMINE HCL 25 MG PO CAPS: 25.0000 mg | ORAL_CAPSULE | Freq: Four times a day (QID) | ORAL | Status: DC | PRN

## 2015-08-03 MED ORDER — CITRIC ACID-SODIUM CITRATE 334-500 MG/5ML PO SOLN
30.0000 mL | ORAL | Status: DC | PRN
  Administered 2015-08-03: 30 mL via ORAL
  Filled 2015-08-03: qty 15

## 2015-08-03 MED ORDER — DOCUSATE SODIUM 100 MG PO CAPS
100.0000 mg | ORAL_CAPSULE | Freq: Two times a day (BID) | ORAL | Status: DC
  Administered 2015-08-04: 100 mg via ORAL
  Filled 2015-08-03 (×2): qty 1

## 2015-08-03 MED ORDER — BISACODYL 10 MG RE SUPP: 10.0000 mg | Freq: Every day | RECTAL | Status: DC | PRN

## 2015-08-03 MED ORDER — OXYCODONE-ACETAMINOPHEN 5-325 MG PO TABS: 2.0000 | ORAL_TABLET | ORAL | Status: DC | PRN

## 2015-08-03 MED ORDER — ONDANSETRON HCL 4 MG PO TABS: 4.0000 mg | ORAL_TABLET | ORAL | Status: DC | PRN

## 2015-08-03 MED ORDER — LACTATED RINGERS IV SOLN: 500.0000 mL | INTRAVENOUS | Status: DC | PRN

## 2015-08-03 MED ORDER — BENZOCAINE-MENTHOL 20-0.5 % EX AERO
1.0000 "application " | INHALATION_SPRAY | CUTANEOUS | Status: DC | PRN
  Administered 2015-08-03: 1 via TOPICAL
  Filled 2015-08-03: qty 56

## 2015-08-03 MED ORDER — WITCH HAZEL-GLYCERIN EX PADS: 1.0000 "application " | MEDICATED_PAD | CUTANEOUS | Status: DC | PRN

## 2015-08-03 NOTE — H&P (Signed)
OBSTETRIC ADMISSION HISTORY AND PHYSICAL  Lindsey Bryan is a 25 y.o. female G1P0000 with IUP at 7152w2d by LMP presenting for SVD. Patient was watching a movie at 0330 when contractions began. Does not recall ROM. Admits to intermittent emesis with some blood. Prior to delivery in MAU, reported no LOF, no VB, no blurry vision, no peripheral edema, or RUQ pain.  She plans on breast feeding. She is undecided on birth control, but has been counseled about nexplanon and IUD.   Dating: By LMP --->  Estimated Date of Delivery: 08/15/15   Prenatal History/Complications: Late Prenatal care (established in December but has not been following up with OB appointments consistently)  Past Medical History: Past Medical History  Diagnosis Date  . Headache(784.0)   . Migraines     Past Surgical History: Past Surgical History  Procedure Laterality Date  . Foot surgery  2013    Obstetrical History: OB History    Gravida Para Term Preterm AB TAB SAB Ectopic Multiple Living   1 0 0 0 0 0 0 0 0 0       Social History: Social History   Social History  . Marital Status: Single    Spouse Name: N/A  . Number of Children: N/A  . Years of Education: N/A   Social History Main Topics  . Smoking status: Never Smoker   . Smokeless tobacco: Never Used  . Alcohol Use: No  . Drug Use: No  . Sexual Activity: No   Other Topics Concern  . None   Social History Narrative    Family History: Family History  Problem Relation Age of Onset  . Asthma Mother   . Diabetes Paternal Grandmother     Allergies: Allergies  Allergen Reactions  . Asa [Aspirin] Nausea And Vomiting  . Diazepam Other (See Comments)    Confused and dizzy    Prescriptions prior to admission  Medication Sig Dispense Refill Last Dose  . calcium carbonate (TUMS - DOSED IN MG ELEMENTAL CALCIUM) 500 MG chewable tablet Chew 1 tablet by mouth as needed for indigestion or heartburn. Patient takes up to 5 times daily.   06/19/2015 at  Unknown time  . NIFEdipine (PROCARDIA-XL/ADALAT-CC/NIFEDICAL-XL) 30 MG 24 hr tablet Take 1 tablet (30 mg total) by mouth daily. 30 tablet 0   . Prenatal Vit-Fe Fumarate-FA (PRENATAL COMPLETE) 14-0.4 MG TABS Take 1 tablet by mouth daily. 60 each 3 06/19/2015 at Unknown time     Review of Systems   All systems reviewed and negative except as stated in HPI  Blood pressure 135/80, pulse 111, temperature 97.5 F (36.4 C), temperature source Oral, resp. rate 20, last menstrual period 10/27/2014. General appearance: alert, cooperative and no distress Lungs: clear to auscultation bilaterally Heart: regular rate and rhythm Pelvic: firm uterus Extremities: Homans sign is negative, no bilateral lower extremity edema Presentation: cephalic Fetal monitoring: none performed as patient was already delivered   Prenatal labs: ABO, Rh: --/--/O POS (12/15 1540) Antibody: NEG (12/15 1540) Rubella: Immune RPR: NON REAC (12/05 1445)  HBsAg: NEGATIVE (12/05 1445)  HIV: NONREACTIVE (12/05 1445)  GBS:   1 hr Glucola Early 135 Genetic screening Negative Anatomy US scheduled but not performed  Prenatal Transfer Tool  Maternal Diabetes: No Genetic Screening: Normal Maternal Ultrasounds/Referrals: Normal Fetal Ultrasounds or other Referrals:  None Maternal Substance Abuse:  Yes:  Type: Marijuana Significant Maternal Medications:  None Significant Maternal Lab Results: Lab values include: Other: GBS unknown  No results found for this or any previous  visit (from the past 24 hour(s)).  Patient Active Problem List   Diagnosis Date Noted  . Active labor 08/03/2015  . Cervical incompetence, antepartum 04/25/2015  . Marijuana use 04/22/2015  . Supervision of low-risk first pregnancy 04/18/2015    Assessment: Lindsey Bryan is a 25 y.o. G1P0000 at [redacted]w[redacted]d here for SVD @ 0900 in MAU.   #Labor: Successful SVD, no complications #Pain: Minimal pain, Motrin PRN #ID: GBS unknown in recent weeks but was  negative on 04/25/15 #MOF: breast #MOC: undecided #Circ:  unknown  Patient seen by myself and Gala Murdoch, MS3  Valentina Shaggy Gambino 08/03/2015, 9:36 AM   I have seen this patient and agree with the above resident's note.  See my delivery summary note.  [name], Muhammad Vacca Certified Nurse-Midwife

## 2015-08-03 NOTE — Lactation Note (Signed)
This note was copied from a baby's chart. Lactation Consultation Note  Patient Name: Boy Lindsey Bryan ZOXWR'UToday's Date: 08/03/2015 Reason for consult: Initial assessment Visited with Mom, baby 3 hrs old.  This is Mom's first baby.  Her nurse stated that baby was sucking on his tongue and wouldn't latch to breast.  Baby skin to skin on Dad, baby's temp running 97.6.   Baby too sleepy to latch at this time.  Talked about manual expression for supplementation of colostrum while baby not interested in latching.  3 ml colostrum syringe fed by her RN.  Reassured Mom that we will be there to assist her with breastfeeding.  Encouraged skin to skin and cue based feedings.  Brochure left with Mom.  Explained about IP and OP Lactation services available to her. To follow up this evening.    Consult Status Consult Status: Follow-up Date: 08/04/15 Follow-up type: In-patient    Judee ClaraSmith, Cereniti Curb E 08/03/2015, 2:20 PM

## 2015-08-03 NOTE — MAU Note (Addendum)
Pt crowning on arrival to MAU, Vaginal delivery @ [phone].  States she began having uc's 2 hours, unsure about SROM.

## 2015-08-04 LAB — RPR: RPR Ser Ql: NONREACTIVE

## 2015-08-04 MED ORDER — IBUPROFEN 600 MG PO TABS: 600.0000 mg | ORAL_TABLET | Freq: Four times a day (QID) | ORAL | Status: AC | PRN

## 2015-08-04 NOTE — Progress Notes (Signed)
POSTPARTUM PROGRESS NOTE  Post Partum Day 1 Subjective:  Lindsey Bryan is a 25 y.o. G1P1001 7239w2d s/p NSVD.  No acute events overnight.  Pt denies problems with ambulating, voiding or po intake.  She denies nausea or vomiting.  Pain is well controlled. Lochia Minimal.   Objective: Blood pressure 130/81, pulse 71, temperature 97.9 F (36.6 C), temperature source Oral, resp. rate 18, last menstrual period 10/27/2014, unknown if currently breastfeeding.  Physical Exam:  General: alert, cooperative and no distress Lochia:normal flow DVT Evaluation: No calf swelling or tenderness Extremities: trace edema   Recent Labs  08/03/15 0946  HGB 10.1*  HCT 31.2*    Assessment/Plan:  ASSESSMENT: Lindsey Bryan is a 25 y.o. G1P1001 4539w2d s/p NSVD.  Discharge home today.   LOS: 1 day   Lindsey Bryan 08/04/2015, 7:13 AM

## 2015-08-04 NOTE — Discharge Summary (Signed)
OB Discharge Summary     Patient Name: Lindsey BeamSelyna Bryan DOB: 1990-11-28 MRN: [id number]  Date of admission: 08/03/2015 Delivering MD: Sharen CounterLEFTWICH-KIRBY, LISA A   Date of discharge: 08/04/2015  Admitting diagnosis: LABOR Intrauterine pregnancy: 7939w2d     Secondary diagnosis:  Active Problems:   Active labor  Additional problems: none     Discharge diagnosis: Term Pregnancy Delivered                                                                                                Post partum procedures:none  Augmentation: none  Complications: None  Hospital course:  Onset of Labor With Vaginal Delivery     25 y.o. yo G1P1001 at 2939w2d was admitted in Active Labor on 08/03/2015. Patient had an uncomplicated labor course as follows:  Membrane Rupture Time/Date: 5:00 AM ,08/03/2015   Intrapartum Procedures: Episiotomy: None [1]                                         Lacerations:  None [1]  Patient had a delivery of a Viable infant. 08/03/2015  Information for the patient's newborn:  Marlyce HugeWilliams, Boy Shivali [[id number]][[id number]]  Delivery Method: Vaginal, Spontaneous Delivery (Filed from Delivery Summary)    Pateint had an uncomplicated postpartum course.  She is ambulating, tolerating a regular diet, passing flatus, and urinating well. Patient is discharged home in stable condition on 08/04/2015.    Physical exam  Filed Vitals:   08/03/15 1129 08/03/15 1241 08/03/15 1635 08/04/15 0516  BP: 112/80 113/84 118/72 130/81  Pulse: 93 90 86 71  Temp: 99.5 F (37.5 C) 99 F (37.2 C) 98.7 F (37.1 C) 97.9 F (36.6 C)  TempSrc: Oral Oral Oral Oral  Resp: 18 20 20 18    General: alert and cooperative Lochia: appropriate Uterine Fundus: firm Incision: N/A DVT Evaluation: No evidence of DVT seen on physical exam. Labs: Lab Results  Component Value Date   WBC 12.8* 08/03/2015   HGB 10.1* 08/03/2015   HCT 31.2* 08/03/2015   MCV 68.3* 08/03/2015   PLT 191 08/03/2015   CMP Latest Ref Rng  04/10/2015  Glucose 65 - 99 mg/dL 86  BUN 6 - 20 mg/dL 6  Creatinine 6.960.44 - 2.951.00 mg/dL 2.840.64  Sodium 132135 - 440145 mmol/L 137  Potassium 3.5 - 5.1 mmol/L 3.5  Chloride 101 - 111 mmol/L 107  CO2 22 - 32 mmol/L 23  Calcium 8.9 - 10.3 mg/dL 8.9  Total Protein 6.5 - 8.1 g/dL 7.0  Total Bilirubin 0.3 - 1.2 mg/dL 0.5  Alkaline Phos 38 - 126 U/L 75  AST 15 - 41 U/L 14(L)  ALT 14 - 54 U/L 14    Discharge instruction: per After Visit Summary and "Baby and Me Booklet".  After visit meds:    Medication List    STOP taking these medications        calcium carbonate 500 MG chewable tablet  Commonly known as:  TUMS - dosed in mg elemental calcium  TAKE these medications        ibuprofen 600 MG tablet  Commonly known as:  ADVIL,MOTRIN  Take 1 tablet (600 mg total) by mouth every 6 (six) hours as needed for fever, headache or moderate pain.     PRENATAL COMPLETE 14-0.4 MG Tabs  Take 1 tablet by mouth daily.        Diet: routine diet  Activity: Advance as tolerated. Pelvic rest for 6 weeks.   Outpatient follow up:6 weeks Follow up Appt:No future appointments. Follow up Visit:No Follow-up on file.  Postpartum contraception: undecided  Newborn Data: Live born female  Birth Weight: 5 lb 13.3 oz (2645 g) APGAR: 8, 9  Baby Feeding: Breast Disposition:home with mother   08/04/2015 Cam Hai, CNM  8:14 AM

## 2015-08-04 NOTE — Progress Notes (Signed)
UR chart review completed.  

## 2015-08-04 NOTE — Discharge Instructions (Signed)

## 2015-09-19 ENCOUNTER — Ambulatory Visit: Payer: Medicaid Other | Admitting: Medical

## 2015-10-06 ENCOUNTER — Ambulatory Visit: Payer: Self-pay

## 2015-10-06 NOTE — Lactation Note (Signed)
This note was copied from a baby's chart. Lactation Consultation Note  Patient Name: Melba CoonJakob Deral Lamar Moore Today's Date: 10/06/2015 Reason for consult: Other (Comment) (pedis pateint . see LC note , residents referral )  Referral was requested 5/24 12:44 p .  LC called Pedis floor within 24 hours  And was told by RN Gunnar Fusi- Paula per Dr. Warnell ForesterAkilah Grimes  [facility]. Baby  Is being D/C today and has been bottle feeding.    Maternal Data    Feeding Feeding Type: Bottle Fed - Formula (24 cal) Nipple Type: Other  LATCH Score/Interventions                      Lactation Tools Discussed/Used     Consult Status Consult Status: Complete    Kathrin Greathouseorio, Taejon Irani Ann 10/06/2015, 12:48 PM

## 2017-01-21 ENCOUNTER — Encounter (HOSPITAL_COMMUNITY): Payer: Self-pay

## 2017-01-21 ENCOUNTER — Emergency Department (HOSPITAL_COMMUNITY): Payer: Medicaid Other

## 2017-01-21 ENCOUNTER — Emergency Department (HOSPITAL_COMMUNITY)
Admission: EM | Admit: 2017-01-21 | Discharge: 2017-01-21 | Disposition: A | Discharge status: Home / Self Care | Payer: Medicaid Other | Attending: Emergency Medicine | Admitting: Emergency Medicine

## 2017-01-21 DIAGNOSIS — R1031 Right lower quadrant pain: Secondary | ICD-10-CM | POA: Diagnosis not present

## 2017-01-21 DIAGNOSIS — Z3A17 17 weeks gestation of pregnancy: Secondary | ICD-10-CM | POA: Diagnosis not present

## 2017-01-21 DIAGNOSIS — O26892 Other specified pregnancy related conditions, second trimester: Secondary | ICD-10-CM | POA: Diagnosis not present

## 2017-01-21 LAB — I-STAT BETA HCG BLOOD, ED (MC, WL, AP ONLY): I-stat hCG, quantitative: >2000 m[IU]/mL — ABNORMAL HIGH (ref <5)

## 2017-01-21 LAB — CBC
HCT: 31.6 % — ABNORMAL LOW (ref 36.0–46.0)
HEMOGLOBIN: 10.3 g/dL — AB (ref 12.0–15.0)
MCH: 23.5 pg — AB (ref 26.0–34.0)
MCHC: 32.6 g/dL (ref 30.0–36.0)
MCV: 72.1 fL — AB (ref 78.0–100.0)
Platelets: 269 10*3/uL (ref 150–400)
RBC: 4.38 MIL/uL (ref 3.87–5.11)
RDW: 14.4 % (ref 11.5–15.5)
WBC: 8.5 10*3/uL (ref 4.0–10.5)

## 2017-01-21 LAB — WET PREP, GENITAL
Clue Cells Wet Prep HPF POC: NONE SEEN
SPERM: NONE SEEN
Trich, Wet Prep: NONE SEEN
YEAST WET PREP: NONE SEEN

## 2017-01-21 LAB — URINALYSIS, ROUTINE W REFLEX MICROSCOPIC
BILIRUBIN URINE: NEGATIVE
GLUCOSE, UA: NEGATIVE mg/dL
Hgb urine dipstick: NEGATIVE
KETONES UR: 5 mg/dL — AB
LEUKOCYTES UA: NEGATIVE
Nitrite: NEGATIVE
PROTEIN: 30 mg/dL — AB
Specific Gravity, Urine: 1.028 (ref 1.005–1.030)
pH: 5 (ref 5.0–8.0)

## 2017-01-21 LAB — COMPREHENSIVE METABOLIC PANEL
ALT: 8 U/L — AB (ref 14–54)
ANION GAP: 6 (ref 5–15)
AST: 13 U/L — ABNORMAL LOW (ref 15–41)
Albumin: 3.3 g/dL — ABNORMAL LOW (ref 3.5–5.0)
Alkaline Phosphatase: 48 U/L (ref 38–126)
BUN: 5 mg/dL — ABNORMAL LOW (ref 6–20)
CHLORIDE: 108 mmol/L (ref 101–111)
CO2: 22 mmol/L (ref 22–32)
CREATININE: 0.47 mg/dL (ref 0.44–1.00)
Calcium: 8.7 mg/dL — ABNORMAL LOW (ref 8.9–10.3)
GFR calc non Af Amer: >60 mL/min (ref >60)
Glucose, Bld: 85 mg/dL (ref 65–99)
Potassium: 3.4 mmol/L — ABNORMAL LOW (ref 3.5–5.1)
SODIUM: 136 mmol/L (ref 135–145)
Total Bilirubin: 0.2 mg/dL — ABNORMAL LOW (ref 0.3–1.2)
Total Protein: 6.6 g/dL (ref 6.5–8.1)

## 2017-01-21 LAB — LIPASE, BLOOD: LIPASE: 23 U/L (ref 11–51)

## 2017-01-21 MED ORDER — DOXYLAMINE SUCCINATE (SLEEP) 25 MG PO TABS: 12.5000 mg | ORAL_TABLET | Freq: Four times a day (QID) | ORAL | 0 refills | Status: AC | PRN

## 2017-01-21 MED ORDER — VITAMIN B-6 25 MG PO TABS: 25.0000 mg | ORAL_TABLET | Freq: Four times a day (QID) | ORAL | 0 refills | Status: AC | PRN

## 2017-01-21 MED ORDER — ONDANSETRON 4 MG PO TBDP
4.0000 mg | ORAL_TABLET | Freq: Once | ORAL | Status: AC
  Administered 2017-01-21: 4 mg via ORAL
  Filled 2017-01-21: qty 1

## 2017-01-21 MED ORDER — ACETAMINOPHEN 500 MG PO TABS
1000.0000 mg | ORAL_TABLET | Freq: Once | ORAL | Status: AC
  Administered 2017-01-21: 1000 mg via ORAL
  Filled 2017-01-21: qty 2

## 2017-01-21 NOTE — ED Notes (Signed)
Bed: WLPT1 Expected date:  Expected time:  Means of arrival:  Comments: 

## 2017-01-21 NOTE — Discharge Instructions (Signed)

## 2017-01-21 NOTE — ED Triage Notes (Addendum)
Patient is [redacted] weeks pregnant. Patient c/o RLQ pain that radiates into her right back. Patient also c/o "N/v x months." patient also reports a white vaginal discharge.  Patient also reports that she has the RLQ pain when she attempts to have a BM/

## 2017-01-21 NOTE — ED Notes (Signed)
Bedside US completed

## 2017-01-21 NOTE — ED Provider Notes (Signed)
[facility]-EMERGENCY DEPT Provider Note   CSN: [id number] Arrival date & time: 01/21/17  1437     History   Chief Complaint Chief Complaint  Patient presents with  . Abdominal Pain    [redacted] weeks pregnant  . Vaginal Discharge    HPI Lindsey Bryan is a 26 y.o. female.  The history is provided by the patient.  Abdominal Pain   This is a new problem. The current episode started 6 to 12 hours ago. The problem occurs constantly (fluctuating). The problem has been gradually worsening. The pain is associated with an unknown factor. The pain is located in the RLQ. The quality of the pain is sharp. The pain is moderate. Associated symptoms include nausea, vomiting (throughout pregnancy; not worse that usual) and constipation. Pertinent negatives include anorexia, fever, diarrhea, melena, dysuria and hematuria. The symptoms are aggravated by certain positions, activity and palpation. Nothing relieves the symptoms.  Vaginal Discharge   This is a new problem. The current episode started 2 days ago. The problem occurs constantly. The problem has not changed since onset.The discharge occurs while at rest. The discharge was white, thick and malodorous. She is pregnant. Associated symptoms include abdominal pain, constipation, nausea and vomiting (throughout pregnancy; not worse that usual). Pertinent negatives include no anorexia, no fever, no diarrhea and no dysuria. She has tried nothing for the symptoms. Her past medical history does not include PID or STD.   Currently 17 wks via US at 16w 2d.   Past Medical History:  Diagnosis Date  . Headache(784.0)   . Migraines     Patient Active Problem List   Diagnosis Date Noted  . Active labor 08/03/2015  . Cervical incompetence, antepartum 04/25/2015  . Marijuana use 04/22/2015  . Supervision of low-risk first pregnancy 04/18/2015    Past Surgical History:  Procedure Laterality Date  . FOOT SURGERY      OB History    Gravida Para Term Preterm  AB Living   1 1 1  0 0 1   SAB TAB Ectopic Multiple Live Births   0 0 0 0 1       Home Medications    Prior to Admission medications   Medication Sig Start Date End Date Taking? Authorizing Provider  doxylamine, Sleep, (UNISOM) 25 MG tablet Take 0.5 tablets (12.5 mg total) by mouth every 6 (six) hours as needed (nausea and vomiting). 01/21/17 02/20/17  Nira Connardama, Mariah Gerstenberger Eduardo, MD  ibuprofen (ADVIL,MOTRIN) 600 MG tablet Take 1 tablet (600 mg total) by mouth every 6 (six) hours as needed for fever, headache or moderate pain. Patient not taking: Reported on 01/21/2017 08/04/15   Arabella MerlesShaw, Kimberly D, CNM  Prenatal Vit-Fe Fumarate-FA (PRENATAL COMPLETE) 14-0.4 MG TABS Take 1 tablet by mouth daily. Patient not taking: Reported on 01/21/2017 03/08/15   Mady GemmaWestfall, Elizabeth C, PA-C  vitamin B-6 (PYRIDOXINE) 25 MG tablet Take 1 tablet (25 mg total) by mouth every 6 (six) hours as needed. 01/21/17 02/20/17  Nira Connardama, Marylin Lathon Eduardo, MD    Family History Family History  Problem Relation Age of Onset  . Asthma Mother   . Diabetes Paternal Grandmother     Social History Social History  Substance Use Topics  . Smoking status: Never Smoker  . Smokeless tobacco: Never Used  . Alcohol use No     Allergies   Asa [aspirin] and Diazepam   Review of Systems Review of Systems  Constitutional: Negative for fever.  Gastrointestinal: Positive for abdominal pain, constipation, nausea and vomiting (throughout pregnancy; not  worse that usual). Negative for anorexia, diarrhea and melena.  Genitourinary: Positive for vaginal discharge. Negative for dysuria and hematuria.   All other systems are reviewed and are negative for acute change except as noted in the HPI   Physical Exam Updated Vital Signs BP 139/78 (BP Location: Left Arm)   Pulse 74   Temp 98.1 F (36.7 C) (Oral)   Resp 16   Ht  (1.6 m)   Wt 77.6 kg (171 lb)   SpO2 100%   BMI 30.29 kg/m   Physical Exam  Constitutional: She is  oriented to person, place, and time. She appears well-developed and well-nourished. No distress.  HENT:  Head: Normocephalic and atraumatic.  Nose: Nose normal.  Eyes: Pupils are equal, round, and reactive to light. Conjunctivae and EOM are normal. Right eye exhibits no discharge. Left eye exhibits no discharge. No scleral icterus.  Neck: Normal range of motion. Neck supple.  Cardiovascular: Normal rate and regular rhythm.  Exam reveals no gallop and no friction rub.   No murmur heard. Pulmonary/Chest: Effort normal and breath sounds normal. No stridor. No respiratory distress. She has no rales.  Abdominal: Soft. She exhibits no distension. There is tenderness in the right lower quadrant. There is tenderness at McBurney's point. There is no rigidity, no rebound and no guarding.  +psoas. Neg Rovsing's and Obturator  Genitourinary: Pelvic exam was performed with patient supine. Uterus is not tender. Cervix exhibits no motion tenderness, no discharge and no friability. Right adnexum displays tenderness. Left adnexum displays no tenderness. No erythema or bleeding in the vagina. No foreign body in the vagina. Vaginal discharge (mucous) found.  Genitourinary Comments: Chaperone present during pelvic exam.   Musculoskeletal: She exhibits no edema or tenderness.  Neurological: She is alert and oriented to person, place, and time.  Skin: Skin is warm and dry. No rash noted. She is not diaphoretic. No erythema.  Psychiatric: She has a normal mood and affect.  Vitals reviewed.    ED Treatments / Results  Labs (all labs ordered are listed, but only abnormal results are displayed) Labs Reviewed  WET PREP, GENITAL - Abnormal; Notable for the following:       Result Value   WBC, Wet Prep HPF POC MANY (*)    All other components within normal limits  COMPREHENSIVE METABOLIC PANEL - Abnormal; Notable for the following:    Potassium 3.4 (*)    BUN 5 (*)    Calcium 8.7 (*)    Albumin 3.3 (*)    AST  13 (*)    ALT 8 (*)    Total Bilirubin 0.2 (*)    All other components within normal limits  CBC - Abnormal; Notable for the following:    Hemoglobin 10.3 (*)    HCT 31.6 (*)    MCV 72.1 (*)    MCH 23.5 (*)    All other components within normal limits  URINALYSIS, ROUTINE W REFLEX MICROSCOPIC - Abnormal; Notable for the following:    APPearance HAZY (*)    Ketones, ur 5 (*)    Protein, ur 30 (*)    Bacteria, UA RARE (*)    Squamous Epithelial / LPF 6-30 (*)    All other components within normal limits  I-STAT BETA HCG BLOOD, ED ([facility], [facility], [facility] ONLY) - Abnormal; Notable for the following:    I-stat hCG, quantitative >2,000.0 (*)    All other components within normal limits  LIPASE, BLOOD  GC/CHLAMYDIA PROBE AMP ([place])  NOT AT [facility]    EKG  EKG Interpretation None       Radiology US Ob Limited > 14 Wks  Result Date: 01/21/2017 CLINICAL DATA:  Pain with positive pregnancy test. EXAM: LIMITED OBSTETRIC ULTRASOUND FINDINGS: Number of Fetuses: 1 Heart Rate:  149 bpm Movement: Visualized. Presentation: Breech Placental Location: Posterior Previa: No. Amniotic Fluid (Subjective):  Within normal limits. BPD:  3.6cm 17w  0d MATERNAL FINDINGS: Cervix:  Appears closed. Uterus/Adnexae: No abnormality visualized. IMPRESSION: Single living intrauterine gestation at estimated 17 week 0 day gestational age by BPD. This exam is performed on an emergent basis and does not comprehensively evaluate fetal size, dating, or anatomy; follow-up complete OB US should be considered if further fetal assessment is warranted. Electronically Signed   By: Kennith Center M.D.   On: 01/21/2017 20:31   US Abdomen Limited  Result Date: 01/21/2017 CLINICAL DATA:  Pain with positive pregnancy test. EXAM: LIMITED OBSTETRIC ULTRASOUND FINDINGS: Number of Fetuses: 1 Heart Rate:  149 bpm Movement: Visualized. Presentation: Breech Placental Location: Posterior Previa: No. Amniotic Fluid (Subjective):  Within normal  limits. BPD:  3.6cm 17w  0d MATERNAL FINDINGS: Cervix:  Appears closed. Uterus/Adnexae: No abnormality visualized. IMPRESSION: Single living intrauterine gestation at estimated 17 week 0 day gestational age by BPD. This exam is performed on an emergent basis and does not comprehensively evaluate fetal size, dating, or anatomy; follow-up complete OB US should be considered if further fetal assessment is warranted. Electronically Signed   By: Kennith Center M.D.   On: 01/21/2017 20:31    Procedures Procedures (including critical care time)  Medications Ordered in ED Medications  acetaminophen (TYLENOL) tablet 1,000 mg (1,000 mg Oral Given 01/21/17 1836)  ondansetron (ZOFRAN-ODT) disintegrating tablet 4 mg (4 mg Oral Given 01/21/17 1837)     Initial Impression / Assessment and Plan / ED Course  I have reviewed the triage vital signs and the nursing notes.  Pertinent labs & imaging results that were available during my care of the patient were reviewed by me and considered in my medical decision making (see chart for details).     Right lower quadrant tenderness to palpation with positive psoas and [redacted] week pregnant female. Presentation concerning for possible appendicitis versus ovarian torsion. Pelvic exam without evidence of cervicitis or impending abortion.  Patient provided with pain medication.  Labs grossly reassuring without leukocytosis. Wet prep negative. UA without evidence of infection.  Ultrasound revealed a viable 17 week fetus without evidence of torsion.  On reexamination there is significant improvement in the patient's symptomatology and repeat abdominal exam has significantly improved. Do not feel that MRI is needed at this time to rule out appendicitis. Early appendicitis cannot be ruled out however I have low suspicion for this given her improvement and patient was given strict return precautions.  Final Clinical Impressions(s) / ED Diagnoses   Final diagnoses:  RLQ  abdominal pain  [redacted] weeks gestation of pregnancy   Disposition: Discharge  Condition: Good  I have discussed the results, Dx and Tx plan with the patient who expressed understanding and agree(s) with the plan. Discharge instructions discussed at great length. The patient was given strict return precautions who verbalized understanding of the instructions. No further questions at time of discharge.    New Prescriptions   DOXYLAMINE, SLEEP, (UNISOM) 25 MG TABLET    Take 0.5 tablets (12.5 mg total) by mouth every 6 (six) hours as needed (nausea and vomiting).   VITAMIN B-6 (PYRIDOXINE) 25 MG TABLET  Take 1 tablet (25 mg total) by mouth every 6 (six) hours as needed.    Follow Up: Lynne Leader, MD [address] [place] [address] [phone]  Schedule an appointment as soon as possible for a visit  As needed  [facility] [address] [place] [address] [phone] Go to  As needed      Nira Conn, MD 01/21/17 2147

## 2017-01-21 NOTE — ED Notes (Signed)
Pt ambulatory and independent at discharge.  Verbalized understanding of discharge instructions 

## 2017-01-21 NOTE — ED Notes (Signed)
ED Provider at bedside. 

## 2017-01-22 LAB — GC/CHLAMYDIA PROBE AMP (~~LOC~~) NOT AT ARMC
CHLAMYDIA, DNA PROBE: NEGATIVE
Neisseria Gonorrhea: NEGATIVE

## 2018-10-22 ENCOUNTER — Encounter: Payer: Self-pay | Admitting: *Deleted

## 2023-11-29 ENCOUNTER — Encounter: Payer: Self-pay | Admitting: Advanced Practice Midwife
